# Patient Record
Sex: Male | Born: 1940
Health system: Southern US, Community
[De-identification: ages and names within clinical notes are randomized; demographics above are authoritative.]

## PROBLEM LIST (undated history)

## (undated) DIAGNOSIS — J439 Emphysema, unspecified: Secondary | ICD-10-CM

## (undated) DIAGNOSIS — I10 Essential (primary) hypertension: Secondary | ICD-10-CM

## (undated) DIAGNOSIS — I5032 Chronic diastolic (congestive) heart failure: Secondary | ICD-10-CM

## (undated) DIAGNOSIS — I4891 Unspecified atrial fibrillation: Secondary | ICD-10-CM

## (undated) DIAGNOSIS — I2781 Cor pulmonale (chronic): Secondary | ICD-10-CM

## (undated) DIAGNOSIS — I4821 Permanent atrial fibrillation: Secondary | ICD-10-CM

## (undated) DIAGNOSIS — J449 Chronic obstructive pulmonary disease, unspecified: Secondary | ICD-10-CM

## (undated) DIAGNOSIS — I509 Heart failure, unspecified: Secondary | ICD-10-CM

## (undated) HISTORY — PX: ABLATION: SHX5711

## (undated) HISTORY — PX: CHOLECYSTECTOMY: SHX55

## (undated) HISTORY — PX: BACK SURGERY: SHX140

## (undated) HISTORY — DX: Cor pulmonale (chronic): I27.81

## (undated) HISTORY — DX: Chronic diastolic (congestive) heart failure: I50.32

## (undated) HISTORY — PX: OTHER SURGICAL HISTORY: SHX169

## (undated) HISTORY — DX: Emphysema, unspecified: J43.9

## (undated) HISTORY — DX: Permanent atrial fibrillation: I48.21

## (undated) HISTORY — DX: Chronic obstructive pulmonary disease, unspecified: J44.9

## (undated) HISTORY — DX: Essential (primary) hypertension: I10

---

## 1995-08-14 HISTORY — PX: PACEMAKER INSERTION: SHX728

## 2015-07-17 DIAGNOSIS — J189 Pneumonia, unspecified organism: Secondary | ICD-10-CM | POA: Diagnosis not present

## 2015-09-19 ENCOUNTER — Emergency Department (HOSPITAL_COMMUNITY): Payer: Medicare HMO

## 2015-09-19 ENCOUNTER — Encounter (HOSPITAL_COMMUNITY): Payer: Self-pay

## 2015-09-19 DIAGNOSIS — Z7902 Long term (current) use of antithrombotics/antiplatelets: Secondary | ICD-10-CM | POA: Insufficient documentation

## 2015-09-19 DIAGNOSIS — J189 Pneumonia, unspecified organism: Secondary | ICD-10-CM | POA: Diagnosis not present

## 2015-09-19 DIAGNOSIS — J209 Acute bronchitis, unspecified: Secondary | ICD-10-CM | POA: Diagnosis not present

## 2015-09-19 DIAGNOSIS — F17200 Nicotine dependence, unspecified, uncomplicated: Secondary | ICD-10-CM | POA: Diagnosis not present

## 2015-09-19 DIAGNOSIS — I509 Heart failure, unspecified: Secondary | ICD-10-CM | POA: Insufficient documentation

## 2015-09-19 DIAGNOSIS — J40 Bronchitis, not specified as acute or chronic: Secondary | ICD-10-CM | POA: Diagnosis not present

## 2015-09-19 DIAGNOSIS — R05 Cough: Secondary | ICD-10-CM | POA: Diagnosis not present

## 2015-09-19 DIAGNOSIS — I10 Essential (primary) hypertension: Secondary | ICD-10-CM | POA: Diagnosis not present

## 2015-09-19 DIAGNOSIS — Z79899 Other long term (current) drug therapy: Secondary | ICD-10-CM | POA: Insufficient documentation

## 2015-09-19 DIAGNOSIS — J159 Unspecified bacterial pneumonia: Secondary | ICD-10-CM | POA: Diagnosis not present

## 2015-09-19 DIAGNOSIS — R0602 Shortness of breath: Secondary | ICD-10-CM | POA: Diagnosis not present

## 2015-09-19 LAB — I-STAT TROPONIN, ED: Troponin i, poc: 0.02 ng/mL (ref 0.00–0.08)

## 2015-09-19 LAB — BASIC METABOLIC PANEL
Anion gap: 14 (ref 5–15)
BUN: 26 mg/dL — ABNORMAL HIGH (ref 6–20)
CO2: 20 mmol/L — ABNORMAL LOW (ref 22–32)
Calcium: 9 mg/dL (ref 8.9–10.3)
Chloride: 102 mmol/L (ref 101–111)
Creatinine, Ser: 1.6 mg/dL — ABNORMAL HIGH (ref 0.61–1.24)
GFR calc Af Amer: 48 mL/min — ABNORMAL LOW (ref >60)
GFR calc non Af Amer: 41 mL/min — ABNORMAL LOW (ref >60)
Glucose, Bld: 154 mg/dL — ABNORMAL HIGH (ref 65–99)
Potassium: 3 mmol/L — ABNORMAL LOW (ref 3.5–5.1)
Sodium: 136 mmol/L (ref 135–145)

## 2015-09-19 LAB — CBC
HCT: 33.6 % — ABNORMAL LOW (ref 39.0–52.0)
Hemoglobin: 11.2 g/dL — ABNORMAL LOW (ref 13.0–17.0)
MCH: 31.2 pg (ref 26.0–34.0)
MCHC: 33.3 g/dL (ref 30.0–36.0)
MCV: 93.6 fL (ref 78.0–100.0)
Platelets: 232 10*3/uL (ref 150–400)
RBC: 3.59 MIL/uL — ABNORMAL LOW (ref 4.22–5.81)
RDW: 14.8 % (ref 11.5–15.5)
WBC: 6.4 10*3/uL (ref 4.0–10.5)

## 2015-09-19 LAB — PROTIME-INR
INR: 1.15 (ref 0.00–1.49)
Prothrombin Time: 14.8 seconds (ref 11.6–15.2)

## 2015-09-19 NOTE — ED Notes (Signed)
Pt reports SOB, chills, hot flashes, coughing (yellow mucus "not much") and wheezing onset Friday night. Denies chest pain or any pain. RR unlabored, skin warm/dry. VSS.

## 2015-09-20 ENCOUNTER — Emergency Department (HOSPITAL_COMMUNITY)
Admission: EM | Admit: 2015-09-20 | Discharge: 2015-09-20 | Disposition: A | Discharge status: Home / Self Care | Payer: Medicare HMO | Attending: Emergency Medicine | Admitting: Emergency Medicine

## 2015-09-20 DIAGNOSIS — J40 Bronchitis, not specified as acute or chronic: Secondary | ICD-10-CM

## 2015-09-20 DIAGNOSIS — J189 Pneumonia, unspecified organism: Secondary | ICD-10-CM

## 2015-09-20 HISTORY — DX: Heart failure, unspecified: I50.9

## 2015-09-20 HISTORY — DX: Essential (primary) hypertension: I10

## 2015-09-20 HISTORY — DX: Unspecified atrial fibrillation: I48.91

## 2015-09-20 MED ORDER — ALBUTEROL SULFATE HFA 108 (90 BASE) MCG/ACT IN AERS
2.0000 | INHALATION_SPRAY | Freq: Once | RESPIRATORY_TRACT | Status: AC
  Administered 2015-09-20: 2 via RESPIRATORY_TRACT
  Filled 2015-09-20: qty 6.7

## 2015-09-20 MED ORDER — ALBUTEROL SULFATE HFA 108 (90 BASE) MCG/ACT IN AERS: 2.0000 | INHALATION_SPRAY | Freq: Four times a day (QID) | RESPIRATORY_TRACT | Status: AC | PRN

## 2015-09-20 MED ORDER — DEXTROSE 5 % IV SOLN
1.0000 g | Freq: Once | INTRAVENOUS | Status: AC
  Administered 2015-09-20: 1 g via INTRAVENOUS
  Filled 2015-09-20: qty 10

## 2015-09-20 MED ORDER — POTASSIUM CHLORIDE 10 MEQ/100ML IV SOLN
10.0000 meq | Freq: Once | INTRAVENOUS | Status: AC
  Administered 2015-09-20: 10 meq via INTRAVENOUS
  Filled 2015-09-20: qty 100

## 2015-09-20 MED ORDER — AZITHROMYCIN 250 MG PO TABS
250.0000 mg | ORAL_TABLET | Freq: Every day | ORAL | Status: DC
Start: 2015-09-20 — End: 2018-09-03

## 2015-09-20 MED ORDER — CEFTRIAXONE SODIUM 1 G IJ SOLR: 1.0000 g | Freq: Once | INTRAMUSCULAR | Status: DC

## 2015-09-20 MED ORDER — METHYLPREDNISOLONE SODIUM SUCC 125 MG IJ SOLR
80.0000 mg | Freq: Once | INTRAMUSCULAR | Status: AC
  Administered 2015-09-20: 80 mg via INTRAVENOUS
  Filled 2015-09-20: qty 2

## 2015-09-20 MED ORDER — AZITHROMYCIN 250 MG PO TABS
500.0000 mg | ORAL_TABLET | Freq: Once | ORAL | Status: AC
  Administered 2015-09-20: 500 mg via ORAL
  Filled 2015-09-20: qty 2

## 2015-09-20 MED ORDER — PREDNISONE 20 MG PO TABS: 40.0000 mg | ORAL_TABLET | Freq: Every day | ORAL | Status: DC

## 2015-09-20 MED ORDER — POTASSIUM CHLORIDE CRYS ER 20 MEQ PO TBCR
40.0000 meq | EXTENDED_RELEASE_TABLET | Freq: Once | ORAL | Status: AC
  Administered 2015-09-20: 40 meq via ORAL
  Filled 2015-09-20: qty 2

## 2015-09-20 NOTE — ED Notes (Signed)
MD at bedside. 

## 2015-09-20 NOTE — ED Notes (Addendum)
This EMT ambulated pt in hallway with pulse ox, stats were 91% throughout duration of walking. Pt only complained of sob when getting back into bed.  While walking pt looked to be in no signs of distress, able to hold a conversation.

## 2015-09-20 NOTE — Discharge Instructions (Signed)
You were seen today for shortness of breath. He likely have pneumonia as well as an element of bronchitis given your smoking history. He will be discharged with an inhaler and steroids as well as antibiotics. See return precautions below.  Community-Acquired Pneumonia, Adult Pneumonia is an infection of the lungs. There are different types of pneumonia. One type can develop while a person is in a hospital. A different type, called community-acquired pneumonia, develops in people who are not, or have not recently been, in the hospital or other health care facility.  CAUSES Pneumonia may be caused by bacteria, viruses, or funguses. Community-acquired pneumonia is often caused by Streptococcus pneumonia bacteria. These bacteria are often passed from one person to another by breathing in droplets from the cough or sneeze of an infected person. RISK FACTORS The condition is more likely to develop in:  People who havechronic diseases, such as chronic obstructive pulmonary disease (COPD), asthma, congestive heart failure, cystic fibrosis, diabetes, or kidney disease.  People who haveearly-stage or late-stage HIV.  People who havesickle cell disease.  People who havehad their spleen removed (splenectomy).  People who havepoor Human resources officer.  People who havemedical conditions that increase the risk of breathing in (aspirating) secretions their own mouth and nose.   People who havea weakened immune system (immunocompromised).  People who smoke.  People whotravel to areas where pneumonia-causing germs commonly exist.  People whoare around animal habitats or animals that have pneumonia-causing germs, including birds, bats, rabbits, cats, and farm animals. SYMPTOMS Symptoms of this condition include:  Adry cough.  A wet (productive) cough.  Fever.  Sweating.  Chest pain, especially when breathing deeply or coughing.  Rapid breathing or difficulty breathing.  Shortness of  breath.  Shaking chills.  Fatigue.  Muscle aches. DIAGNOSIS Your health care provider will take a medical history and perform a physical exam. You may also have other tests, including:  Imaging studies of your chest, including X-rays.  Tests to check your blood oxygen level and other blood gases.  Other tests on blood, mucus (sputum), fluid around your lungs (pleural fluid), and urine. If your pneumonia is severe, other tests may be done to identify the specific cause of your illness. TREATMENT The type of treatment that you receive depends on many factors, such as the cause of your pneumonia, the medicines you take, and other medical conditions that you have. For most adults, treatment and recovery from pneumonia may occur at home. In some cases, treatment must happen in a hospital. Treatment may include:  Antibiotic medicines, if the pneumonia was caused by bacteria.  Antiviral medicines, if the pneumonia was caused by a virus.  Medicines that are given by mouth or through an IV tube.  Oxygen.  Respiratory therapy. Although rare, treating severe pneumonia may include:  Mechanical ventilation. This is done if you are not breathing well on your own and you cannot maintain a safe blood oxygen level.  Thoracentesis. This procedureremoves fluid around one lung or both lungs to help you breathe better. HOME CARE INSTRUCTIONS  Take over-the-counter and prescription medicines only as told by your health care provider.  Only takecough medicine if you are losing sleep. Understand that cough medicine can prevent your body's natural ability to remove mucus from your lungs.  If you were prescribed an antibiotic medicine, take it as told by your health care provider. Do not stop taking the antibiotic even if you start to feel better.  Sleep in a semi-upright position at night. Try sleeping in  a reclining chair, or place a few pillows under your head.  Do not use tobacco products,  including cigarettes, chewing tobacco, and e-cigarettes. If you need help quitting, ask your health care provider.  Drink enough water to keep your urine clear or pale yellow. This will help to thin out mucus secretions in your lungs. PREVENTION There are ways that you can decrease your risk of developing community-acquired pneumonia. Consider getting a pneumococcal vaccine if:  You are older than 74 years of age.  You are older than 74 years of age and are undergoing cancer treatment, have chronic lung disease, or have other medical conditions that affect your immune system. Ask your health care provider if this applies to you. There are different types and schedules of pneumococcal vaccines. Ask your health care provider which vaccination option is best for you. You may also prevent community-acquired pneumonia if you take these actions:  Get an influenza vaccine every year. Ask your health care provider which type of influenza vaccine is best for you.  Go to the dentist on a regular basis.  Wash your hands often. Use hand sanitizer if soap and water are not available. SEEK MEDICAL CARE IF:  You have a fever.  You are losing sleep because you cannot control your cough with cough medicine. SEEK IMMEDIATE MEDICAL CARE IF:  You have worsening shortness of breath.  You have increased chest pain.  Your sickness becomes worse, especially if you are an older adult or have a weakened immune system.  You cough up blood.   This information is not intended to replace advice given to you by your health care provider. Make sure you discuss any questions you have with your health care provider.   Document Released: 07/30/2005 Document Revised: 04/20/2015 Document Reviewed: 11/24/2014 Elsevier Interactive Patient Education [employer].

## 2015-09-20 NOTE — ED Provider Notes (Signed)
CSN: [id number]     Arrival date & time 09/19/15  2034 History  By signing my name below, I, Johnny Woods, attest that this documentation has been prepared under the direction and in the presence of Merryl Hacker, MD Electronically Signed: Evonnie Dawes, ED Scribe 09/20/2015 at 5:57 AM.    Chief Complaint  Patient presents with  . Shortness of Breath   The history is provided by the patient. No language interpreter was used.   HPI Comments: Johnny Woods is a 75 y.o. male with a pacemaker, who presents to the Emergency Department complaining of intermittent chills, hot flashes, cough and a slight fever with Tmax 40F, which began after getting a pneumonia vaccination on Friday, 2/3. Pt confirms some SOB. Pt denies any CP, sore throat, or N/V/D. Pt is a current smoker. Pt denies any hx of COPD.  is a current smoker. History of heart failure and atrial fibrillation.  Past Medical History  Diagnosis Date  . Atrial fibrillation ([place])   . Hypertension   . CHF (congestive heart failure) [facility])    Past Surgical History  Procedure Laterality Date  . Pacemaker insertion  1997  . Ablation    . Back surgery    . Cholecystectomy    . Ruptured testicle     No family history on file. Social History  Substance Use Topics  . Smoking status: Current Some Day Smoker  . Smokeless tobacco: None  . Alcohol Use: No    Review of Systems  Constitutional: Positive for fever and chills.       Hot flashes  HENT: Negative for sore throat.   Respiratory: Positive for cough (Productive), shortness of breath and wheezing.   Cardiovascular: Negative for chest pain.  Gastrointestinal: Negative for nausea, vomiting and diarrhea.  Skin: Negative for rash.  All other systems reviewed and are negative.  Allergies  Review of patient's allergies indicates no known allergies.  Home Medications   Prior to Admission medications   Medication Sig Start Date End Date Taking? Authorizing Provider   amLODipine (NORVASC) 10 MG tablet Take 10 mg by mouth at bedtime.   Yes Historical Provider, MD  clopidogrel (PLAVIX) 75 MG tablet Take 75 mg by mouth daily.   Yes Historical Provider, MD  doxazosin (CARDURA) 8 MG tablet Take 8 mg by mouth at bedtime.   Yes Historical Provider, MD  enalapril (VASOTEC) 20 MG tablet Take 20 mg by mouth 2 (two) times daily.   Yes Historical Provider, MD  hydrochlorothiazide (HYDRODIURIL) 25 MG tablet Take 25 mg by mouth daily as needed (fluid/swelling).   Yes Historical Provider, MD  vitamin C (ASCORBIC ACID) 500 MG tablet Take 1,000 mg by mouth daily.   Yes Historical Provider, MD  vitamin E 400 UNIT capsule Take 400 Units by mouth daily.   Yes Historical Provider, MD  albuterol (PROVENTIL HFA;VENTOLIN HFA) 108 (90 Base) MCG/ACT inhaler Inhale 2 puffs into the lungs every 6 (six) hours as needed for wheezing or shortness of breath. 09/20/15   Merryl Hacker, MD  azithromycin (ZITHROMAX) 250 MG tablet Take 1 tablet (250 mg total) by mouth daily. Take first 2 tablets together, then 1 every day until finished. 09/20/15   Merryl Hacker, MD  predniSONE (DELTASONE) 20 MG tablet Take 2 tablets (40 mg total) by mouth daily with breakfast. 09/20/15   Merryl Hacker, MD   BP 139/58 mmHg  Pulse 65  Temp(Src) 98.5 F (36.9 C) (Oral)  Resp 17  SpO2  95% Physical Exam  Constitutional: He is oriented to person, place, and time. He appears well-developed and well-nourished. No distress.  HENT:  Head: Normocephalic and atraumatic.  Cardiovascular: Normal rate, regular rhythm and normal heart sounds.   No murmur heard. Pulmonary/Chest: Effort normal. No respiratory distress. He has wheezes.  Scant expiratory wheezing  Abdominal: Soft. Bowel sounds are normal. There is no tenderness. There is no rebound.  Musculoskeletal: He exhibits no edema.  Neurological: He is alert and oriented to person, place, and time.  Skin: Skin is warm and dry.  Psychiatric: He has a normal  mood and affect.  Nursing note and vitals reviewed.   ED Course  Procedures   DIAGNOSTIC STUDIES: Oxygen Saturation is 95% on RA, adequate by my interpretation.    COORDINATION OF CARE: 1:15 AM-Discussed treatment plan which includes DG Chest, Blood work, EKG, and cardiac monitoring,  with pt at bedside and pt agreed to plan.   Labs Review Labs Reviewed  BASIC METABOLIC PANEL - Abnormal; Notable for the following:    Potassium 3.0 (*)    CO2 20 (*)    Glucose, Bld 154 (*)    BUN 26 (*)    Creatinine, Ser 1.60 (*)    GFR calc non Af Amer 41 (*)    GFR calc Af Amer 48 (*)    All other components within normal limits  CBC - Abnormal; Notable for the following:    RBC 3.59 (*)    Hemoglobin 11.2 (*)    HCT 33.6 (*)    All other components within normal limits  PROTIME-INR  I-STAT TROPOININ, ED    Imaging Review Dg Chest 2 View  09/19/2015  CLINICAL DATA:  Shortness of breath chills cough flashes coughing productive cough for 3 days EXAM: CHEST  2 VIEW COMPARISON:  There are no prior studies for comparison FINDINGS: Two lead pacer with generator over left thorax. Heart size upper normal. Vascular pattern normal. Moderately severe diffuse interstitial prominence. No consolidation. Tiny right pleural effusion. IMPRESSION: Moderately severe interstitial disease diffusely in both lungs. Differential diagnostic possibilities include atypical pneumonia or chronic interstitial lung disease. Chronicity uncertain without the benefit of comparison. Tiny nonspecific right pleural effusion. Electronically Signed   By: Skipper Cliche M.D.   On: 09/19/2015 21:37   I have personally reviewed and evaluated these images and lab results as part of my medical decision-making.   EKG Interpretation   Date/Time:  Monday September 19 2015 20:42:41 EST Ventricular Rate:  72 PR Interval:    QRS Duration: 154 QT Interval:  506 QTC Calculation: 554 R Axis:   -67 Text Interpretation:   Ventricular-paced rhythm Abnormal ECG No prior for  comparison Confirmed by HORTON  MD, [place] ([id number]) on 09/20/2015 1:04:08  AM      MDM   Final diagnoses:  Bronchitis  Atypical pneumonia   Patient presents with cough and shortness of breath. Nontoxic on exam. Afebrile. Does have some scant expiratory wheezing. Is a current smoker. No reported history of COPD. Chest x-ray shows moderately severe interstitial disease diffusely. Atypical pneumonia versus chronic interstitial lung disease. Initially patient ambulated and dropped his O2 sats to 88% and felt short of breath. He was given steroids and an albuterol inhaler. He was given Rocephin and azithromycin for presumed community-acquired pneumonia. No history of recent hospitalizations.  On recheck, patient reports improvement of shortness of breath after inhaler. He continues to have some scant wheezing. Patient was instructed to use his inhaler one more time.  He was then ambulated and maintained his oxygen saturations greater than 91%. Suspect some element of bronchitis or bronchospasm. Will discharge home with azithromycin, steroids, and an inhaler.  Follow-up with primary physician. Patient was given strict return precautions.  After history, exam, and medical workup I feel the patient has been appropriately medically screened and is safe for discharge home. Pertinent diagnoses were discussed with the patient. Patient was given return precautions.  I personally performed the services described in this documentation, which was scribed in my presence. The recorded information has been reviewed and is accurate.      Merryl Hacker, MD 09/20/15 [phone]

## 2015-09-20 NOTE — ED Notes (Signed)
Ambulated patient in hallway while checking pulse oximetry. Prior to ambulation patient pulse rate was 63 and oxygen levels were 91% on room air. Throughout ambulation pulse rate increased to 80 and oxygen levels fluctuated, going down to as low as 88% and as high as 92% on room air. Towards the end of ambulation patient begins to state he feels "short of breath", however speaking full sentences and no apparent distress noted.

## 2015-11-23 DIAGNOSIS — R634 Abnormal weight loss: Secondary | ICD-10-CM | POA: Diagnosis not present

## 2015-11-23 DIAGNOSIS — R935 Abnormal findings on diagnostic imaging of other abdominal regions, including retroperitoneum: Secondary | ICD-10-CM | POA: Diagnosis not present

## 2017-12-04 IMAGING — DX DG CHEST 2V
2 series · 2 of 2 positions shown · non-contrast
Comparison: There are no prior studies for comparison

CLINICAL DATA: Shortness of breath chills cough flashes coughing
productive cough for 3 days

EXAM:
CHEST  2 VIEW

[chest pa]
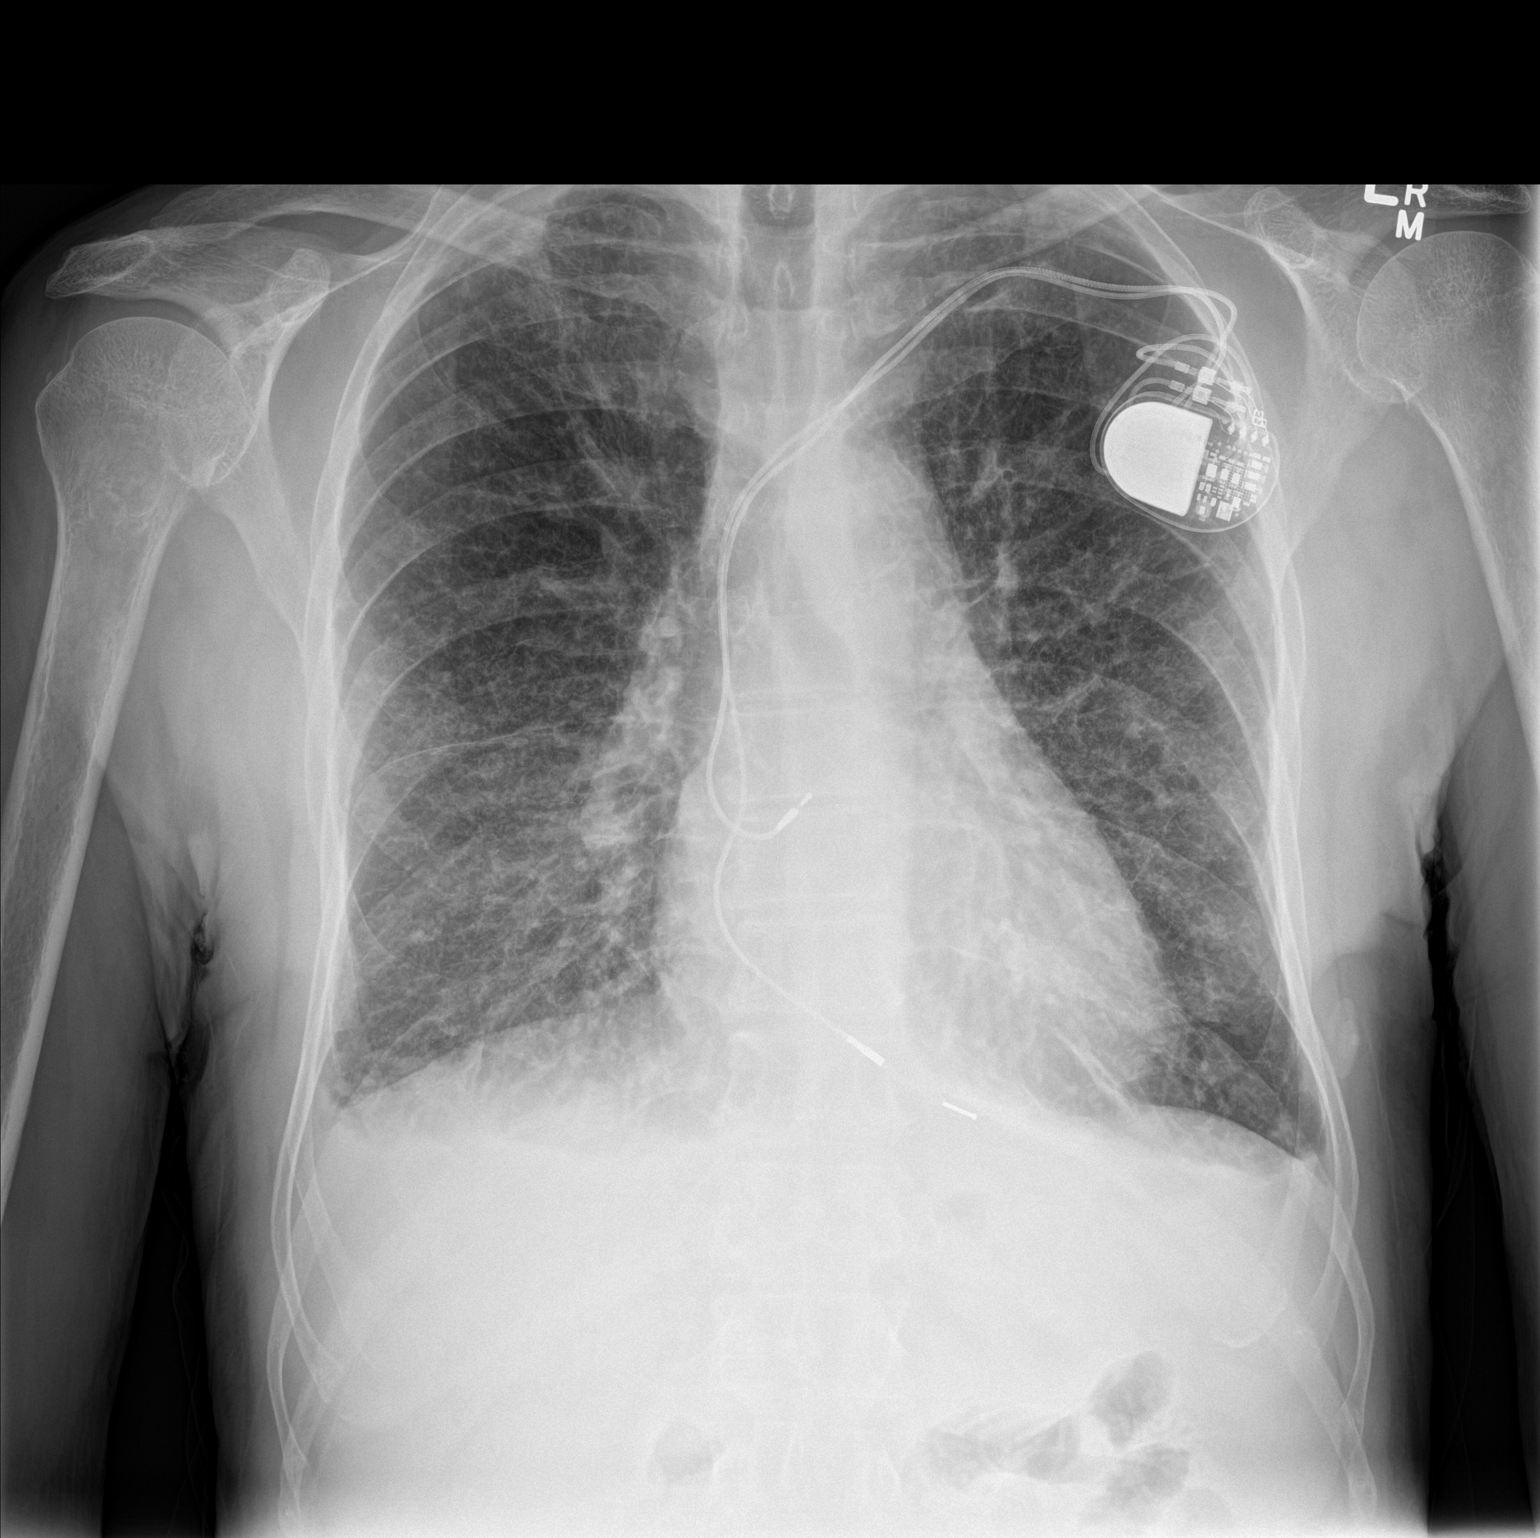

[chest lat]
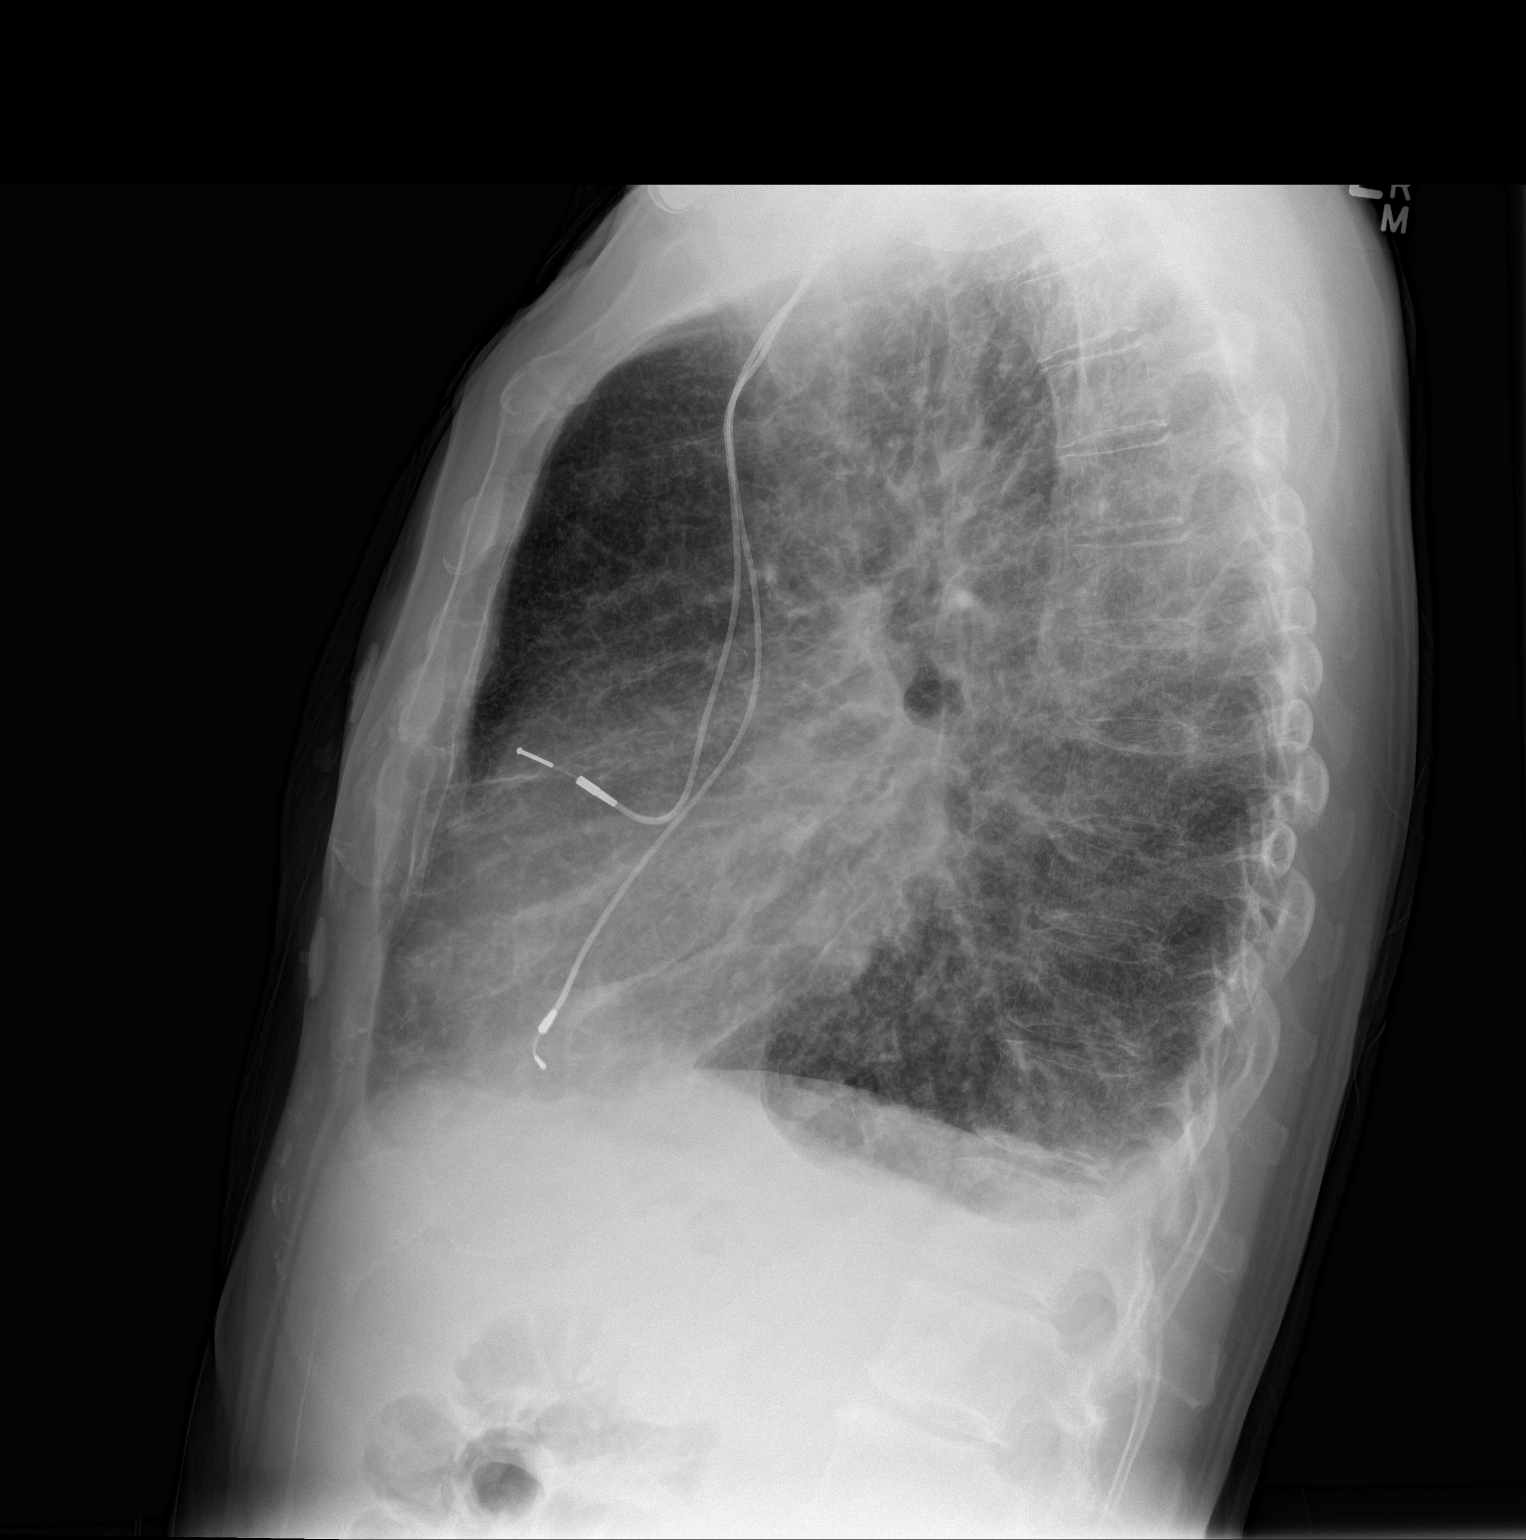

[2 of 2 positions shown; findings below may reference images not displayed]

FINDINGS: Two lead pacer with generator over left thorax. Heart size upper
normal. Vascular pattern normal. Moderately severe diffuse
interstitial prominence. No consolidation. Tiny right pleural
effusion.
IMPRESSION: Moderately severe interstitial disease diffusely in both lungs.
Differential diagnostic possibilities include atypical pneumonia or
chronic interstitial lung disease. Chronicity uncertain without the
benefit of comparison.

Tiny nonspecific right pleural effusion.

## 2018-08-13 DIAGNOSIS — C3490 Malignant neoplasm of unspecified part of unspecified bronchus or lung: Secondary | ICD-10-CM

## 2018-08-13 HISTORY — DX: Malignant neoplasm of unspecified part of unspecified bronchus or lung: C34.90

## 2018-09-03 ENCOUNTER — Other Ambulatory Visit: Payer: Self-pay

## 2018-09-03 ENCOUNTER — Emergency Department (HOSPITAL_COMMUNITY)
Admission: EM | Admit: 2018-09-03 | Discharge: 2018-09-03 | Disposition: A | Discharge status: Home / Self Care | Payer: Medicare HMO | Attending: Emergency Medicine | Admitting: Emergency Medicine

## 2018-09-03 DIAGNOSIS — Z7901 Long term (current) use of anticoagulants: Secondary | ICD-10-CM | POA: Diagnosis not present

## 2018-09-03 DIAGNOSIS — I110 Hypertensive heart disease with heart failure: Secondary | ICD-10-CM | POA: Diagnosis not present

## 2018-09-03 DIAGNOSIS — Z950 Presence of cardiac pacemaker: Secondary | ICD-10-CM | POA: Diagnosis not present

## 2018-09-03 DIAGNOSIS — Z79899 Other long term (current) drug therapy: Secondary | ICD-10-CM | POA: Diagnosis not present

## 2018-09-03 DIAGNOSIS — T783XXA Angioneurotic edema, initial encounter: Secondary | ICD-10-CM | POA: Diagnosis not present

## 2018-09-03 DIAGNOSIS — I509 Heart failure, unspecified: Secondary | ICD-10-CM | POA: Insufficient documentation

## 2018-09-03 DIAGNOSIS — R220 Localized swelling, mass and lump, head: Secondary | ICD-10-CM | POA: Diagnosis present

## 2018-09-03 DIAGNOSIS — F17210 Nicotine dependence, cigarettes, uncomplicated: Secondary | ICD-10-CM | POA: Insufficient documentation

## 2018-09-03 MED ORDER — METHYLPREDNISOLONE SODIUM SUCC 125 MG IJ SOLR
125.0000 mg | Freq: Once | INTRAMUSCULAR | Status: AC
Start: 2018-09-03 — End: 2018-09-03
  Administered 2018-09-03: 125 mg via INTRAVENOUS
  Filled 2018-09-03: qty 2

## 2018-09-03 MED ORDER — FAMOTIDINE IN NACL 20-0.9 MG/50ML-% IV SOLN
20.0000 mg | Freq: Once | INTRAVENOUS | Status: AC
Start: 2018-09-03 — End: 2018-09-03
  Administered 2018-09-03: 20 mg via INTRAVENOUS
  Filled 2018-09-03: qty 50

## 2018-09-03 MED ORDER — DIPHENHYDRAMINE HCL 50 MG/ML IJ SOLN
25.0000 mg | Freq: Once | INTRAMUSCULAR | Status: AC
Start: 2018-09-03 — End: 2018-09-03
  Administered 2018-09-03: 25 mg via INTRAVENOUS
  Filled 2018-09-03: qty 1

## 2018-09-03 NOTE — ED Triage Notes (Signed)
Per pt he was at home and felt his bottom lip was swelling about 8:30 tonight and then it progressively got bigger when he woke up and now the entire bottom lip is swollen. Pt has no difficulty breathing at this time

## 2018-09-03 NOTE — ED Provider Notes (Signed)
[place] EMERGENCY DEPARTMENT Provider Note   CSN: [id number] Arrival date & time: 09/03/18  0220     History   Chief Complaint Chief Complaint  Patient presents with  . Facial Swelling    lower bottom lip    HPI Zohar Maroney Shannon is a 78 y.o. male with a history of atrial fibrillation on Eliquis, congestive heart failure, and hypertension who presents to the emergency department with a chief complaint of lip swelling.  The patient notes constant, worsening swelling to his upper lip that he first noticed yesterday morning.  He reports initial onset was gradual, but noticed the swelling looked much worse around 8:30 PM on the right side of the lower lip.  He reports that when he awoke around 1 AM that his entire lower lip was swollen.   He denies chest pain, shortness of breath, sore throat, dysphagia, upper lip, tongue, facial, or neck swelling, or rash.   No treatment prior to arrival.  He reports that he has taken enalapril for more than 20 years.  Last dose was this morning. He is scheduled for a lung biopsy at [facility] tomorrow at 9:30 AM.   The history is provided by the patient. No language interpreter was used.    Past Medical History:  Diagnosis Date  . Atrial fibrillation ([place])   . CHF (congestive heart failure) ([place])   . Hypertension     There are no active problems to display for this patient.   Past Surgical History:  Procedure Laterality Date  . ABLATION    . BACK SURGERY    . CHOLECYSTECTOMY    . PACEMAKER INSERTION  1997  . ruptured testicle          Home Medications    Prior to Admission medications   Medication Sig Start Date End Date Taking? Authorizing Provider  albuterol (PROVENTIL HFA;VENTOLIN HFA) 108 (90 Base) MCG/ACT inhaler Inhale 2 puffs into the lungs every 6 (six) hours as needed for wheezing or shortness of breath. 09/20/15  Yes Horton, Barbette Hair, MD  amLODipine (NORVASC) 10 MG tablet Take 10 mg by mouth at bedtime.    Yes [provider]  apixaban (ELIQUIS) 5 MG TABS tablet Take 5 mg by mouth 2 (two) times daily.   Yes [provider]  atorvastatin (LIPITOR) 20 MG tablet Take 20 mg by mouth daily.   Yes [provider]  doxazosin (CARDURA) 8 MG tablet Take 8 mg by mouth at bedtime.   Yes [provider]  enalapril (VASOTEC) 20 MG tablet Take 20 mg by mouth 2 (two) times daily.   Yes [provider]  hydrochlorothiazide (HYDRODIURIL) 25 MG tablet Take 25 mg by mouth daily as needed (fluid/swelling).   Yes [provider]  tiotropium (SPIRIVA) 18 MCG inhalation capsule Place 18 mcg into inhaler and inhale daily.   Yes [provider]  vitamin C (ASCORBIC ACID) 500 MG tablet Take 1,000 mg by mouth daily.   Yes [provider]  vitamin E 400 UNIT capsule Take 400 Units by mouth daily.   Yes [provider]    Family History No family history on file.  Social History Social History   Tobacco Use  . Smoking status: Current Some Day Smoker  Substance Use Topics  . Alcohol use: No  . Drug use: Not on file     Allergies   Patient has no known allergies.   Review of Systems Review of Systems  Constitutional: Negative  for appetite change and fever.  HENT: Positive for facial swelling. Negative for sore throat and trouble swallowing.   Respiratory: Negative for cough and shortness of breath.   Cardiovascular: Negative for chest pain, palpitations and leg swelling.  Gastrointestinal: Negative for abdominal pain.  Genitourinary: Negative for dysuria.  Musculoskeletal: Negative for back pain, myalgias, neck pain and neck stiffness.  Skin: Negative for rash.  Allergic/Immunologic: Negative for immunocompromised state.  Neurological: Negative for headaches.  Psychiatric/Behavioral: Negative for confusion.   Physical Exam Updated Vital Signs BP 138/60   Pulse 64   Temp (!) 97.4 F (36.3 C) (Oral)   Resp 14   Ht 5\' 9"   (1.753 m)   Wt 64.4 kg   SpO2 96%   BMI 20.97 kg/m   Physical Exam Vitals signs and nursing note reviewed.  Constitutional:      Appearance: He is well-developed.  HENT:     Head: Normocephalic.     Mouth/Throat:     Mouth: Angioedema present. No oral lesions.     Comments: Angioedema noted to the inferior lip.  No sublingual tenderness or induration.  No induration noted to the anterior neck.  He is speaking in complete, fluent sentences.  He is tolerating secretions.  Full active and passive range of motion of the neck. Eyes:     Conjunctiva/sclera: Conjunctivae normal.  Neck:     Musculoskeletal: Neck supple.  Cardiovascular:     Rate and Rhythm: Normal rate and regular rhythm.     Heart sounds: No murmur.  Pulmonary:     Effort: Pulmonary effort is normal. No respiratory distress.     Breath sounds: No stridor. No wheezing, rhonchi or rales.  Chest:     Chest wall: No tenderness.  Abdominal:     General: There is no distension.     Palpations: Abdomen is soft.  Skin:    General: Skin is warm and dry.     Comments: No urticarial rash.  Neurological:     Mental Status: He is alert.  Psychiatric:        Behavior: Behavior normal.        ED Treatments / Results  Labs (all labs ordered are listed, but only abnormal results are displayed) Labs Reviewed - No data to display  EKG None  Radiology No results found.  Procedures Procedures (including critical care time)  Medications Ordered in ED Medications  diphenhydrAMINE (BENADRYL) injection 25 mg (25 mg Intravenous Given 09/03/18 0340)  famotidine (PEPCID) IVPB 20 mg premix (0 mg Intravenous Stopped 09/03/18 0441)  methylPREDNISolone sodium succinate (SOLU-MEDROL) 125 mg/2 mL injection 125 mg (125 mg Intravenous Given 09/03/18 0340)     Initial Impression / Assessment and Plan / ED Course  I have reviewed the triage vital signs and the nursing notes.  Pertinent labs & imaging results that were available  during my care of the patient were reviewed by me and considered in my medical decision making (see chart for details).     78 year old male with a history of atrial fibrillation on Eliquis, congestive heart failure, and hypertension presenting with angioedema, onset today.  He has been on enalapril for more than 20 years.  He has no chest pain, shortness of breath, or urticarial rash.  He speaking in complete, fluent sentences and tolerating to secretions well.  Methylprednisolone, Benadryl, and Pepcid given in the ED. The patient was seen and evaluated along with Dr. Leonette Monarch, attending physician.   The patient was observed for >5 hours.  Swelling has improved.  He continues to have noted shortness of breath, chest pain.  Will discharge to home with PCP follow-up.  The patient has been advised to discontinue his enalapril, but continue his amlodipine, HCTZ, and doxazosin until he can be seen by his PCP.  Strict return precautions given.  He is hemodynamically stable and in no acute distress.  She is safe for discharge home with outpatient follow-up at this time.   Final Clinical Impressions(s) / ED Diagnoses   Final diagnoses:  Angioedema, initial encounter    ED Discharge Orders    None       Joanne Gavel, PA-C 09/03/18 [place], [place], MD 09/04/18 [phone]

## 2018-09-03 NOTE — Discharge Instructions (Signed)
Thank you for allowing me to care for you today in the Emergency Department.   DO NOT TAKE ANY ADDITIONAL DOSES OF YOUR HOME VASOTEC (enalapril). This is likely what caused your symptoms today.  Continue to take your home as prescribed:  -Hydrochlorothiazide -Amlodipine - Doxazosin  Please call your primary care provider to schedule a follow-up appointment.  Call [facility] at the number you were given to determine if you were able to have your lung biopsy today.  Return to the emergency department if you develop worsening swelling of your lips, face, neck, difficulty breathing, chest pain, head to toe rash, or other new, concerning symptoms.

## 2018-09-03 NOTE — ED Notes (Signed)
Pt able to speak in complete sentences, no signs of respiratory distress at this time.  No trouble swallowing.

## 2018-09-03 NOTE — ED Notes (Signed)
Swelling has decreased slightly.  Continues to not have any trouble breathing, talking or swallowing.

## 2018-09-21 ENCOUNTER — Emergency Department (HOSPITAL_COMMUNITY)
Admission: EM | Admit: 2018-09-21 | Discharge: 2018-09-21 | Disposition: A | Discharge status: Home / Self Care | Payer: Medicare HMO | Attending: Emergency Medicine | Admitting: Emergency Medicine

## 2018-09-21 ENCOUNTER — Encounter (HOSPITAL_COMMUNITY): Payer: Self-pay

## 2018-09-21 ENCOUNTER — Emergency Department (HOSPITAL_COMMUNITY): Payer: Medicare HMO

## 2018-09-21 DIAGNOSIS — F17200 Nicotine dependence, unspecified, uncomplicated: Secondary | ICD-10-CM | POA: Diagnosis not present

## 2018-09-21 DIAGNOSIS — Z79899 Other long term (current) drug therapy: Secondary | ICD-10-CM | POA: Diagnosis not present

## 2018-09-21 DIAGNOSIS — J40 Bronchitis, not specified as acute or chronic: Secondary | ICD-10-CM | POA: Insufficient documentation

## 2018-09-21 DIAGNOSIS — R0602 Shortness of breath: Secondary | ICD-10-CM | POA: Diagnosis not present

## 2018-09-21 DIAGNOSIS — Z950 Presence of cardiac pacemaker: Secondary | ICD-10-CM | POA: Diagnosis not present

## 2018-09-21 DIAGNOSIS — R05 Cough: Secondary | ICD-10-CM | POA: Diagnosis not present

## 2018-09-21 LAB — BRAIN NATRIURETIC PEPTIDE: B Natriuretic Peptide: 321.1 pg/mL — ABNORMAL HIGH (ref 0.0–100.0)

## 2018-09-21 LAB — CBC
HCT: 32.5 % — ABNORMAL LOW (ref 39.0–52.0)
Hemoglobin: 10.4 g/dL — ABNORMAL LOW (ref 13.0–17.0)
MCH: 31.4 pg (ref 26.0–34.0)
MCHC: 32 g/dL (ref 30.0–36.0)
MCV: 98.2 fL (ref 80.0–100.0)
Platelets: 149 10*3/uL — ABNORMAL LOW (ref 150–400)
RBC: 3.31 MIL/uL — ABNORMAL LOW (ref 4.22–5.81)
RDW: 14.4 % (ref 11.5–15.5)
WBC: 5.7 10*3/uL (ref 4.0–10.5)
nRBC: 0 % (ref 0.0–0.2)

## 2018-09-21 LAB — BASIC METABOLIC PANEL
Anion gap: 10 (ref 5–15)
BUN: 40 mg/dL — ABNORMAL HIGH (ref 8–23)
CO2: 20 mmol/L — ABNORMAL LOW (ref 22–32)
Calcium: 8.9 mg/dL (ref 8.9–10.3)
Chloride: 107 mmol/L (ref 98–111)
Creatinine, Ser: 2.03 mg/dL — ABNORMAL HIGH (ref 0.61–1.24)
GFR calc Af Amer: 36 mL/min — ABNORMAL LOW (ref >60)
GFR calc non Af Amer: 31 mL/min — ABNORMAL LOW (ref >60)
Glucose, Bld: 134 mg/dL — ABNORMAL HIGH (ref 70–99)
Potassium: 3.7 mmol/L (ref 3.5–5.1)
Sodium: 137 mmol/L (ref 135–145)

## 2018-09-21 MED ORDER — IPRATROPIUM-ALBUTEROL 0.5-2.5 (3) MG/3ML IN SOLN
3.0000 mL | Freq: Once | RESPIRATORY_TRACT | Status: AC
  Administered 2018-09-21: 3 mL via RESPIRATORY_TRACT
  Filled 2018-09-21: qty 3

## 2018-09-21 MED ORDER — PREDNISONE 50 MG PO TABS: 50.0000 mg | ORAL_TABLET | Freq: Every day | ORAL | 0 refills | Status: DC

## 2018-09-21 MED ORDER — DOXYCYCLINE HYCLATE 100 MG PO CAPS: 100.0000 mg | ORAL_CAPSULE | Freq: Two times a day (BID) | ORAL | 0 refills | Status: DC

## 2018-09-21 MED ORDER — SODIUM CHLORIDE 0.9% FLUSH: 3.0000 mL | Freq: Once | INTRAVENOUS | Status: DC

## 2018-09-21 NOTE — Discharge Instructions (Addendum)
Follow up with your pulmonary doctors, continue your current medications, take the new medications as prescribed

## 2018-09-21 NOTE — ED Provider Notes (Signed)
[facility] EMERGENCY DEPARTMENT Provider Note   CSN: [id number] Arrival date & time: 09/21/18  1221     History   Chief Complaint Chief Complaint  Patient presents with  . Shortness of Breath  . Cough    HPI Johnny Woods is a 78 y.o. male.   Shortness of Breath  Associated symptoms: cough   Cough  Associated symptoms: shortness of breath    Pt started having trouble with cough and congestion over the weekend.  He has been coughing and has felt chilled.  Pt had a fever to 102 yesterday.  This morning he felt worse.  He is not on home oxygen but he was able to check his pulse ox at home.  It was in the 80s.  Pt also has history of pleural effusion and had a thoracentesis several weeks ago.  Pt had a lung biopsy about a week ago at the [place].   Past Medical History:  Diagnosis Date  . Atrial fibrillation ([place])   . CHF (congestive heart failure) ([place])   . Hypertension     There are no active problems to display for this patient.   Past Surgical History:  Procedure Laterality Date  . ABLATION    . BACK SURGERY    . CHOLECYSTECTOMY    . PACEMAKER INSERTION  1997  . ruptured testicle          Home Medications    Prior to Admission medications   Medication Sig Start Date End Date Taking? Authorizing Provider  albuterol (PROVENTIL HFA;VENTOLIN HFA) 108 (90 Base) MCG/ACT inhaler Inhale 2 puffs into the lungs every 6 (six) hours as needed for wheezing or shortness of breath. 09/20/15   Horton, Barbette Hair, MD  amLODipine (NORVASC) 10 MG tablet Take 10 mg by mouth at bedtime.    [provider]  apixaban (ELIQUIS) 5 MG TABS tablet Take 5 mg by mouth 2 (two) times daily.    [provider]  atorvastatin (LIPITOR) 20 MG tablet Take 20 mg by mouth daily.    [provider]  doxazosin (CARDURA) 8 MG tablet Take 8 mg by mouth at bedtime.    [provider]  doxycycline (VIBRAMYCIN) 100 MG capsule Take 1 capsule (100 mg  total) by mouth 2 (two) times daily. 09/21/18   Dorie Rank, MD  enalapril (VASOTEC) 20 MG tablet Take 20 mg by mouth 2 (two) times daily.    [provider]  hydrochlorothiazide (HYDRODIURIL) 25 MG tablet Take 25 mg by mouth daily as needed (fluid/swelling).    [provider]  predniSONE (DELTASONE) 50 MG tablet Take 1 tablet (50 mg total) by mouth daily. 09/21/18   Dorie Rank, MD  tiotropium (SPIRIVA) 18 MCG inhalation capsule Place 18 mcg into inhaler and inhale daily.    [provider]  vitamin C (ASCORBIC ACID) 500 MG tablet Take 1,000 mg by mouth daily.    [provider]  vitamin E 400 UNIT capsule Take 400 Units by mouth daily.    [provider]    Family History No family history on file.  Social History Social History   Tobacco Use  . Smoking status: Current Some Day Smoker  Substance Use Topics  . Alcohol use: No  . Drug use: Not on file     Allergies   Lisinopril   Review of Systems Review of Systems  Respiratory: Positive for cough and shortness of breath.   All other systems reviewed and are  negative.    Physical Exam Updated Vital Signs BP (!) 110/48   Pulse 61   Temp (!) 97.4 F (36.3 C) (Oral)   Resp 19   SpO2 100%   Physical Exam Vitals signs and nursing note reviewed.  Constitutional:      General: He is not in acute distress.    Comments: Elderly, frail   HENT:     Head: Normocephalic and atraumatic.     Right Ear: External ear normal.     Left Ear: External ear normal.  Eyes:     General: No scleral icterus.       Right eye: No discharge.        Left eye: No discharge.     Conjunctiva/sclera: Conjunctivae normal.  Neck:     Musculoskeletal: Neck supple.     Trachea: No tracheal deviation.  Cardiovascular:     Rate and Rhythm: Normal rate and regular rhythm.  Pulmonary:     Effort: Pulmonary effort is normal. No respiratory distress.     Breath sounds: Normal breath sounds. No stridor. No  wheezing or rales.  Abdominal:     General: Bowel sounds are normal. There is no distension.     Palpations: Abdomen is soft.     Tenderness: There is no abdominal tenderness. There is no guarding or rebound.  Musculoskeletal:        General: No tenderness.  Skin:    General: Skin is warm and dry.     Findings: No rash.  Neurological:     Mental Status: He is alert.     Cranial Nerves: No cranial nerve deficit (no facial droop, extraocular movements intact, no slurred speech).     Sensory: No sensory deficit.     Motor: No abnormal muscle tone or seizure activity.     Coordination: Coordination normal.      ED Treatments / Results  Labs (all labs ordered are listed, but only abnormal results are displayed) Labs Reviewed  BASIC METABOLIC PANEL - Abnormal; Notable for the following components:      Result Value   CO2 20 (*)    Glucose, Bld 134 (*)    BUN 40 (*)    Creatinine, Ser 2.03 (*)    GFR calc non Af Amer 31 (*)    GFR calc Af Amer 36 (*)    All other components within normal limits  CBC - Abnormal; Notable for the following components:   RBC 3.31 (*)    Hemoglobin 10.4 (*)    HCT 32.5 (*)    Platelets 149 (*)    All other components within normal limits  BRAIN NATRIURETIC PEPTIDE - Abnormal; Notable for the following components:   B Natriuretic Peptide 321.1 (*)    All other components within normal limits    EKG EKG Interpretation  Date/Time:  Sunday September 21 2018 12:25:25 EST Ventricular Rate:  69 PR Interval:    QRS Duration: 176 QT Interval:  464 QTC Calculation: 497 R Axis:   -71 Text Interpretation:  Ventricular-paced rhythm Abnormal ECG No significant change since last tracing Confirmed by Dorie Rank [phone]) on 09/21/2018 12:57:17 PM   Radiology Dg Chest 2 View  Result Date: 09/21/2018 CLINICAL DATA:  78 year old male with 2 days of shortness of breath productive cough and congestion. EXAM: CHEST - 2 VIEW COMPARISON:  Chest radiographs  09/19/2015. FINDINGS: Stable lung volumes and mediastinal contours. Cardiac size at the upper limits of normal. Stable chronic left chest cardiac pacemaker.  Small bilateral pleural effusions in 2017 are regressed or resolved. Diffuse increased pulmonary interstitial markings appear stable. Chronic upper lobe scarring greater on the right. No pneumothorax, consolidation or areas of worsening ventilation. No acute osseous abnormality identified. Negative visible bowel gas pattern. IMPRESSION: 1. Diffuse pulmonary interstitial markings are stable compared to 2017. Top differential considerations include chronic interstitial lung disease, recurrent acute viral/atypical respiratory infection. Interstitial edema felt less likely. 2. Small pleural effusions in 2017 have regressed or resolved. 3. Borderline to mild cardiomegaly. Electronically Signed   By: Genevie Ann M.D.   On: 09/21/2018 14:14    Procedures Procedures (including critical care time)  Medications Ordered in ED Medications  sodium chloride flush (NS) 0.9 % injection 3 mL (3 mLs Intravenous Not Given 09/21/18 1312)  ipratropium-albuterol (DUONEB) 0.5-2.5 (3) MG/3ML nebulizer solution 3 mL (3 mLs Nebulization Given 09/21/18 1312)     Initial Impression / Assessment and Plan / ED Course  I have reviewed the triage vital signs and the nursing notes.  Pertinent labs & imaging results that were available during my care of the patient were reviewed by me and considered in my medical decision making (see chart for details).  Clinical Course as of Sep 21 1501  Sun Sep 21, 2018  1430 Elevated compared to prior however previous values several years ago.  No leukocytosis.  Chest x-ray without obvious pneumonia.   [JK]    Clinical Course User Index [JK] Dorie Rank, MD    Patient presented to the ED for evaluation of cough, shortness of breath and congestion.  ED work-up is reassuring.  No evidence of pneumonia.  Signs of pulmonary edema or recurrent  pleural effusion.  Patient's oxygen saturation has remained stable.  Patient does have known history of malignancy.  He has been coughing frequently.  I suspect he is having a component of bronchitis.  Plan on discharge home with supportive treatment.  Final Clinical Impressions(s) / ED Diagnoses   Final diagnoses:  Bronchitis    ED Discharge Orders         Ordered    doxycycline (VIBRAMYCIN) 100 MG capsule  2 times daily     09/21/18 1503    predniSONE (DELTASONE) 50 MG tablet  Daily     09/21/18 1503           Dorie Rank, MD 09/21/18 1503

## 2018-09-21 NOTE — ED Notes (Signed)
Patient verbalizes understanding of discharge instructions. Opportunity for questioning and answers were provided. Armband removed by staff, pt discharged from ED.  

## 2018-09-21 NOTE — ED Triage Notes (Signed)
Pt reports 2 days of increased SOB, cough and congestion. Pt states he checked his oxygen level at home and it was 87% on room air. Pt does not wear any oxygen. 93% in triage on room air. Denies any Chest pain, nad noted

## 2018-09-21 NOTE — ED Notes (Signed)
Ambulated pt while monitoring pulse oximetry. SpO2 stayed between 93% and 96%. Pt tolerated well.

## 2020-10-05 ENCOUNTER — Other Ambulatory Visit: Payer: Self-pay

## 2020-10-05 ENCOUNTER — Encounter (HOSPITAL_COMMUNITY): Payer: Self-pay | Admitting: Emergency Medicine

## 2020-10-05 ENCOUNTER — Emergency Department (HOSPITAL_COMMUNITY): Payer: No Typology Code available for payment source

## 2020-10-05 ENCOUNTER — Observation Stay (HOSPITAL_COMMUNITY)
Admission: EM | Admit: 2020-10-05 | Discharge: 2020-10-06 | Disposition: A | Discharge status: Home / Self Care | Payer: No Typology Code available for payment source | Attending: Family Medicine | Admitting: Family Medicine

## 2020-10-05 DIAGNOSIS — J449 Chronic obstructive pulmonary disease, unspecified: Secondary | ICD-10-CM | POA: Diagnosis not present

## 2020-10-05 DIAGNOSIS — G6282 Radiation-induced polyneuropathy: Secondary | ICD-10-CM | POA: Diagnosis not present

## 2020-10-05 DIAGNOSIS — R0602 Shortness of breath: Secondary | ICD-10-CM | POA: Diagnosis not present

## 2020-10-05 DIAGNOSIS — I495 Sick sinus syndrome: Secondary | ICD-10-CM | POA: Diagnosis not present

## 2020-10-05 DIAGNOSIS — Z79899 Other long term (current) drug therapy: Secondary | ICD-10-CM | POA: Diagnosis not present

## 2020-10-05 DIAGNOSIS — I509 Heart failure, unspecified: Secondary | ICD-10-CM

## 2020-10-05 DIAGNOSIS — N184 Chronic kidney disease, stage 4 (severe): Secondary | ICD-10-CM | POA: Diagnosis not present

## 2020-10-05 DIAGNOSIS — Z950 Presence of cardiac pacemaker: Secondary | ICD-10-CM | POA: Insufficient documentation

## 2020-10-05 DIAGNOSIS — I5023 Acute on chronic systolic (congestive) heart failure: Secondary | ICD-10-CM | POA: Diagnosis not present

## 2020-10-05 DIAGNOSIS — I110 Hypertensive heart disease with heart failure: Secondary | ICD-10-CM | POA: Insufficient documentation

## 2020-10-05 DIAGNOSIS — F17200 Nicotine dependence, unspecified, uncomplicated: Secondary | ICD-10-CM | POA: Diagnosis not present

## 2020-10-05 DIAGNOSIS — Z7901 Long term (current) use of anticoagulants: Secondary | ICD-10-CM | POA: Insufficient documentation

## 2020-10-05 DIAGNOSIS — I4821 Permanent atrial fibrillation: Secondary | ICD-10-CM | POA: Diagnosis not present

## 2020-10-05 DIAGNOSIS — Z20822 Contact with and (suspected) exposure to covid-19: Secondary | ICD-10-CM | POA: Diagnosis not present

## 2020-10-05 DIAGNOSIS — I5033 Acute on chronic diastolic (congestive) heart failure: Secondary | ICD-10-CM | POA: Diagnosis not present

## 2020-10-05 LAB — BASIC METABOLIC PANEL
Anion gap: 13 (ref 5–15)
BUN: 69 mg/dL — ABNORMAL HIGH (ref 8–23)
CO2: 22 mmol/L (ref 22–32)
Calcium: 9.4 mg/dL (ref 8.9–10.3)
Chloride: 99 mmol/L (ref 98–111)
Creatinine, Ser: 2.38 mg/dL — ABNORMAL HIGH (ref 0.61–1.24)
GFR, Estimated: 27 mL/min — ABNORMAL LOW (ref >60)
Glucose, Bld: 273 mg/dL — ABNORMAL HIGH (ref 70–99)
Potassium: 3.8 mmol/L (ref 3.5–5.1)
Sodium: 134 mmol/L — ABNORMAL LOW (ref 135–145)

## 2020-10-05 LAB — CBC
HCT: 30.5 % — ABNORMAL LOW (ref 39.0–52.0)
Hemoglobin: 9.9 g/dL — ABNORMAL LOW (ref 13.0–17.0)
MCH: 32.9 pg (ref 26.0–34.0)
MCHC: 32.5 g/dL (ref 30.0–36.0)
MCV: 101.3 fL — ABNORMAL HIGH (ref 80.0–100.0)
Platelets: 220 10*3/uL (ref 150–400)
RBC: 3.01 MIL/uL — ABNORMAL LOW (ref 4.22–5.81)
RDW: 15.9 % — ABNORMAL HIGH (ref 11.5–15.5)
WBC: 5.3 10*3/uL (ref 4.0–10.5)
nRBC: 0 % (ref 0.0–0.2)

## 2020-10-05 LAB — TROPONIN I (HIGH SENSITIVITY)
Troponin I (High Sensitivity): 29 ng/L — ABNORMAL HIGH (ref <18)
Troponin I (High Sensitivity): 28 ng/L — ABNORMAL HIGH (ref <18)

## 2020-10-05 LAB — RESP PANEL BY RT-PCR (FLU A&B, COVID) ARPGX2
Influenza A by PCR: NEGATIVE
Influenza B by PCR: NEGATIVE
SARS Coronavirus 2 by RT PCR: NEGATIVE

## 2020-10-05 LAB — BRAIN NATRIURETIC PEPTIDE: B Natriuretic Peptide: 288.3 pg/mL — ABNORMAL HIGH (ref 0.0–100.0)

## 2020-10-05 MED ORDER — ACETAMINOPHEN 325 MG PO TABS
650.0000 mg | ORAL_TABLET | Freq: Once | ORAL | Status: AC
  Administered 2020-10-05: 650 mg via ORAL
  Filled 2020-10-05: qty 2

## 2020-10-05 MED ORDER — FUROSEMIDE 10 MG/ML IJ SOLN
60.0000 mg | Freq: Once | INTRAMUSCULAR | Status: AC
  Administered 2020-10-05: 60 mg via INTRAVENOUS
  Filled 2020-10-05: qty 6

## 2020-10-05 MED ORDER — ROPINIROLE HCL 1 MG PO TABS
10.0000 mg | ORAL_TABLET | Freq: Every day | ORAL | Status: DC
  Filled 2020-10-05 (×2): qty 10

## 2020-10-05 MED ORDER — OXYCODONE HCL 5 MG PO TABS
5.0000 mg | ORAL_TABLET | Freq: Two times a day (BID) | ORAL | Status: DC | PRN
  Administered 2020-10-06 (×2): 5 mg via ORAL
  Filled 2020-10-05 (×2): qty 1

## 2020-10-05 MED ORDER — ACETAMINOPHEN 325 MG PO TABS: 650.0000 mg | ORAL_TABLET | Freq: Four times a day (QID) | ORAL | Status: DC | PRN

## 2020-10-05 MED ORDER — FAMOTIDINE 20 MG PO TABS: 20.0000 mg | ORAL_TABLET | Freq: Every evening | ORAL | Status: DC

## 2020-10-05 MED ORDER — FUROSEMIDE 10 MG/ML IJ SOLN
40.0000 mg | Freq: Two times a day (BID) | INTRAMUSCULAR | Status: DC
  Administered 2020-10-06 (×2): 40 mg via INTRAVENOUS
  Filled 2020-10-05 (×2): qty 4

## 2020-10-05 MED ORDER — APIXABAN 5 MG PO TABS
5.0000 mg | ORAL_TABLET | Freq: Two times a day (BID) | ORAL | Status: DC
  Administered 2020-10-06 (×2): 5 mg via ORAL
  Filled 2020-10-05 (×2): qty 1

## 2020-10-05 MED ORDER — ACETAMINOPHEN 650 MG RE SUPP: 650.0000 mg | Freq: Four times a day (QID) | RECTAL | Status: DC | PRN

## 2020-10-05 MED ORDER — TRAZODONE HCL 50 MG PO TABS
50.0000 mg | ORAL_TABLET | Freq: Every day | ORAL | Status: DC
  Administered 2020-10-06: 50 mg via ORAL
  Filled 2020-10-05: qty 1

## 2020-10-05 MED ORDER — IPRATROPIUM-ALBUTEROL 20-100 MCG/ACT IN AERS
1.0000 | INHALATION_SPRAY | Freq: Four times a day (QID) | RESPIRATORY_TRACT | Status: DC
  Administered 2020-10-06 (×3): 1 via RESPIRATORY_TRACT
  Filled 2020-10-05: qty 4

## 2020-10-05 MED ORDER — ALBUTEROL SULFATE HFA 108 (90 BASE) MCG/ACT IN AERS: 2.0000 | INHALATION_SPRAY | Freq: Four times a day (QID) | RESPIRATORY_TRACT | Status: DC | PRN

## 2020-10-05 MED ORDER — FUROSEMIDE 10 MG/ML IJ SOLN
INTRAMUSCULAR | Status: AC
  Filled 2020-10-05: qty 4

## 2020-10-05 MED ORDER — FENTANYL CITRATE (PF) 100 MCG/2ML IJ SOLN
25.0000 ug | Freq: Once | INTRAMUSCULAR | Status: DC
Start: 2020-10-05 — End: 2020-10-05

## 2020-10-05 MED ORDER — POLYETHYLENE GLYCOL 3350 17 G PO PACK: 17.0000 g | PACK | Freq: Every day | ORAL | Status: DC | PRN

## 2020-10-05 NOTE — H&P (Addendum)
[facility] Admission History and Physical Service Pager: [phone]  Patient name: Johnny Woods Medical record number: [id number] Date of birth: Jul 25, 1941 Age: 80 y.o. Gender: male  Primary Care Provider: [facility]: Cards Code Status: Full code, patient states "I need to live one more year" Preferred Emergency Contact: Arnette Felts, wife, [phone]  Chief Complaint: SOB  Assessment and Plan: Johnny Woods is a 80 y.o. male presenting with SOB. PMH is significant for CHF, a fib (s/p ablation), sick sinus syndrome (pacemaker in place) GERD, COPD, Non-small cell lung cancer (treated at [facility])   Dyspnea  CHF Patient presents with increased dyspnea at his oncologist appointment today and was advised to come to the ED. He reports increased shortness of breath and weakness with ambulation over the past month, and SOB while laying down and has been sitting up in a chair more because of it. Endorses typically using 1 pillow at night. He also reports over the past several days taking both Chlorthalidone 25mg  and HCTZ 25mg  because if his increased swelling, when he typically just takes Chlorthalidone. Patient's cardiologist is through the [place]. He has a history of CHF with last echo was 1 year ago, unsure of it's results. In ED, received 60mg  IV lasix, with 900 cc urine output since. BNP 288. Troponin flat at 29 >28. EKG showing junctional rhythm . Does have a pacer in place. CXR with chronic scarring, no acute intrathoracic process. On physical exam patient is volume overloaded, with 2+ pitting edema in BLE up to calf, and rales in lower lobes bilaterally (L>R), and presence of JVD. SOB likely due to fluid overload and progression of CHF. Can't rule out PE, as he is increased clot risk with his lung cancer history, although low likelyhood. Wells score of 1 and patient is currently on Eliquis 5mg  BID. Could have a respiratory illness in addition to his CHF as he  does have cough and congestion though more chronic in nature. COVID pending. Will admit patient for IV diuresis and strict monitoring of urine output. Cardiology consulted in the ED and will see patient tomorrow. -Admit to [facility], Med-tele, Attending Dr. Erin Hearing  - Cardiology consulted, appreciate recommendations  - S/p 1 dose 60mg  IV lasix with good response in ED. Patient does not take a loop diuretic at home. Due to this will reduce dose and diurese with 40mg  IV Lasix BID tomorrow, as he will likely have a good response with it as well. Will closely monitor urine output - Will get Echo - Continuous cardiac monitoring - Continuous pulse ox - Vitals per floor - Strict I's and O's - Daily weights - PT/OT evaluate and treat  AKI Cr. 2.38. On 09/21/18 was 2.03, and 09/19/15 was 1.60. Limited records since patient is seen at the [place]. Likely secondary to fluid overload as patient does have JVD to the angle of the jaw and pitting edema, in the setting of CHF exacerbation.  Will likely improve with diuresis. - Monitor closely as we plan to diurese -a.m. BMP  Anemia Hgb 9.9. Baseline appears to be around 11. Decrease from baseline likely dilutional in setting of fluid overload, however will continue to monitor. Anemia may be chronic in the setting of his malignancy. - AM CBC -can consider iron studies  HTN BP 151/47 on admission, must recently 139/60. Appears to be stable. Patient reports taking HCTZ on an as needed basis and recently was taking chlorthalidone as well as hydrochlorothiazide for an increase in lower  limb edema. - continue to monitor  - Will hold HCTZ and chlorthalidone tonight and can consider restarting cholorothalidone   Hyperglycemia Glucose 273 on admission. No record of diabetes.  No previous A1c in his chart. - will check Hgb A1c  - sliding scale insulin  Sick Sinus Syndrome  Patient has pacemaker in place since ~1997. - continuous cardiac monitoring   Atrial  fibrillation, chronic  Currently on Eliquis 5mg  BID. Hx of ablation. Pacemaker in place per above.  - continuous cardiac monitoring   Non-Small Cell Lung Cancer Stage 1A2 Per documentation from 12/21. Currently sees oncology. S/p chemotherapy and radiation, currently on immunotherapy every 6 weeks.   GERD Home med: Pepcid 20mg  daily  - continue home med   COPD Home med: Ipratropium-Albuterol 1 puff q 6 hours, Albuterol 2 puffs q6h prn  - continue home meds   Restless leg  leg and foot pain Home med: Ropinirole 10mg  at bedtine, Oxycodone 10mg  daily  - Tylenol 650 q 6h prn  - Oxy 5mg  BID prn   Tobacco Use disorder  Patient reports 30-35 pack year smoking history. Has decreased his current cigarette use to 5-6 daily  - can offer nicotine patch if needed   Constipation Reports occasional constipation, particularly when taking his pain medications. Last BM yesterday. Home med: Milk of magnesia  - Miralax prn   Urinary retention  BPH Patient reports taking tamsulosin 0.4mg  as needed. Has not used this in the past several weeks. -We will monitor urine output while diuresing  FEN/GI: heart healthy, carb modified diet  Prophylaxis: Eliquis 5mg  BID  Disposition: Med-tele   History of Present Illness:  Johnny Woods is a 80 y.o. male presenting with dyspnea. Hx a fib, HTN, CHF, lung cancer being treated by [place].  He states about a month ago he noted increased shortness of breath with ambulation. Went to [place] ED last week who he states did an Xray and then "they didn't do anything" and sent him home.   He has a history of lung cancer, diagnosed 2 yeas ago. S/p chemo and radiation and immunotherapy. He was at his oncologist office today with SOB and they recommended he come in. He notes that lately he gets short of breath when laying down, normally uses one pillow but lately has felt more short of breath and has needed to sit up in the chair. Sometimes wakes up 2-3x at night for that.    Denies CP, palpitations. HA. Does endorse occasionally seeing spots and blurry vision chronically for several years. Does report coughing up brown mucus. He also states he has a reduction in taste sensation. Denies hemoptysis, hematuria, or hematochezia.    Tobacco use 4-5 cigarettes per day, sometimes a bit more or less. Smoked off and on, but reports smoking in total for about 35 years, in the past did about 1 ppd  No alcohol, no drugs besides the prescribed oxycodone.  Of note, he had a steroid taper 2 months ago for concern of a COPD exaccerbation. He was given 5 for 5 days, 4 for 5 days, 3 for 5 days, 2 for 5 days, 1 for 5 days and then stop, he was off them for ~3 weeks and went back to the [place] 9 days ago who represcribed it. He finished the 2nd set of 5 for 5 days and was on the 4th day of 4 tabs per day. Spoke to pharmacy who did not recommend restarting it since he had been off of steroids  for a couple of weeks prior to restarting it and had been on less than 10 days.    Review Of Systems: Per HPI with the following additions:   Review of Systems  Constitutional: Negative for fever.  HENT: Positive for congestion.   Respiratory: Positive for cough and shortness of breath.   Cardiovascular: Positive for leg swelling. Negative for chest pain.  Gastrointestinal: Positive for abdominal pain (occasional with constipation) and constipation. Negative for blood in stool.  Genitourinary: Negative for difficulty urinating.  Neurological: Negative for headaches.     Patient Active Problem List   Diagnosis Date Noted  . CHF exacerbation ([place]) 10/05/2020  . CHF (congestive heart failure) ([place]) 10/05/2020    Past Medical History: Past Medical History:  Diagnosis Date  . Atrial fibrillation ([place])   . CHF (congestive heart failure) ([place])   . Hypertension     Past Surgical History: Past Surgical History:  Procedure Laterality Date  . ABLATION    . BACK SURGERY    . CHOLECYSTECTOMY     . PACEMAKER INSERTION  1997  . ruptured testicle      Social History: Social History   Tobacco Use  . Smoking status: Current Some Day Smoker  Substance Use Topics  . Alcohol use: No   Additional social history: Please also refer to relevant sections of EMR.  Family History: No family history on file.  Allergies and Medications: Allergies  Allergen Reactions  . Lisinopril     Angioedema   . Amlodipine Swelling    Swelling of ankles    No current facility-administered medications on file prior to encounter.   Current Outpatient Medications on File Prior to Encounter  Medication Sig Dispense Refill  . albuterol (PROVENTIL HFA;VENTOLIN HFA) 108 (90 Base) MCG/ACT inhaler Inhale 2 puffs into the lungs every 6 (six) hours as needed for wheezing or shortness of breath. 1 Inhaler 0  . apixaban (ELIQUIS) 5 MG TABS tablet Take 5 mg by mouth 2 (two) times daily.    . chlorthalidone (HYGROTON) 25 MG tablet Take 25 mg by mouth daily.    . famotidine (PEPCID) 20 MG tablet Take 20 mg by mouth every evening.    . hydrochlorothiazide (HYDRODIURIL) 25 MG tablet Take 25 mg by mouth daily as needed (fluid/swelling).    . Ipratropium-Albuterol (COMBIVENT RESPIMAT) 20-100 MCG/ACT AERS respimat Inhale 1 puff into the lungs every 6 (six) hours.    . Oxycodone HCl 10 MG TABS Take 10 mg by mouth daily as needed.    . ropinirole (REQUIP) 5 MG tablet Take 10 mg by mouth at bedtime.    . traZODone (DESYREL) 50 MG tablet Take 50 mg by mouth at bedtime.    . vitamin E 400 UNIT capsule Take 400 Units by mouth daily.      Objective: BP 139/60   Pulse 63   Temp (!) 97.4 F (36.3 C) (Oral)   Resp 20   SpO2 96%  Exam: General: alert, pleasant, NAD Eyes: Conjunctivae normal, EOMI ENTM: MMM Neck: Supple. JVD present to level of angle of the jaw  Cardiovascular: RRR, no murmurs Respiratory: Rales in lower lobes bilaterally (L>R). Normal WOB on RA  Gastrointestinal: soft, non distended  MSK: BLE  2+ pitting edema up to mid calf.  Derm: warm, dry Neuro: A&Ox4. No focal deficits  Psych: mood and affect normal  Labs and Imaging: CBC BMET  Recent Labs  Lab 10/05/20 1528  WBC 5.3  HGB 9.9*  HCT 30.5*  PLT  220   Recent Labs  Lab 10/05/20 1528  NA 134*  K 3.8  CL 99  CO2 22  BUN 69*  CREATININE 2.38*  GLUCOSE 273*  CALCIUM 9.4     EKG: junctional rhythm. Pacemaker in place  DG Chest 2 View  Result Date: 10/05/2020 CLINICAL DATA:  Short of breath and congestion for 1 week, tobacco abuse EXAM: CHEST - 2 VIEW COMPARISON:  09/21/2018 FINDINGS: Frontal and lateral views of the chest demonstrates stable dual lead pacemaker. Cardiac silhouette is unremarkable. Diffuse interstitial scarring is unchanged. No acute airspace disease, effusion, or pneumothorax. No acute bony abnormalities. IMPRESSION: 1. Chronic scarring, no acute intrathoracic process. Electronically Signed   By: Randa Ngo M.D.   On: 10/05/2020 15:56    Shary Key, DO 10/05/2020, 11:24 PM PGY-1, [facility] pager: [phone], text pages welcome  Upper Level Addendum:  I have seen and evaluated this patient along with Dr. Arby Barrette and reviewed the above note, making necessary revisions in green.   Lurline Del, DO 10/06/2020, 12:39 AM PGY-2, [place]

## 2020-10-05 NOTE — H&P (Incomplete)
[facility] Admission History and Physical Service Pager: [phone]  Patient name: Bion Todorov Cragle Medical record number: [id number] Date of birth: 12-27-1940 Age: 80 y.o. Gender: male  Primary Care Provider: [facility]: Cards Code Status: Full code, patient states "I need to live one more year" Preferred Emergency Contact: Arnette Felts, wife, [phone]  Chief Complaint: SOB  Assessment and Plan: Sven Pinheiro Tye is a 80 y.o. male presenting with SOB. PMH is significant for CHF, a fib (s/p ablation), GERD, COPD, lung cancer (treated at [facility])   Dyspnea  CHF Patient presents with increased dyspnea at his oncologist appointment today and was advised to come to the ED. He reports increased shortness of breath and weakness with ambulation over the past month, and SOB while laying down and has been sitting up in a chair more because of it. Endorses typically using 1 pillow at night. He also reports over the past several days taking both Chlorthalidone 25mg  and HCTZ 25mg  because if his increased swelling, when he typically just takes Chlorthalidone. Patient's cardiologist is through the [place]. Last echo was 1 year ago. In ED,  received 60mg  IV lasix. BNP 288. Troponin flat at 29 >28. EKG showing junctional rhythm . Does have a pacer in place. On physical exam patient volume overloaded, with 2+ pitting edema in BLE up to calf, and rales in lower lobes bilaterally (L>R), and presence of JVD  SOB likely due to fluid overload and progression of CHF. Can't rule out PE, as he is increased clot risk with his lung cancer. Wells score of 1. Currently on Eliquis  Admit for IV diuresis. Cardiology consulted and will see patient tomorrow.  -Admit to [place], Med-tele, Attending Dr. Erin Hearing  - Cardiology consult, appreciate recommendations  - s/p 1 dose lasix with good response in ED. Will diurese with 40 Lasix BID tomorrow, and will closely monitor urine output - Echo -  Continuous cardiac monitoring - Continuous pulse ox - Vitals per floor - Strict I's and O's - Daily weights - PT/OT evaluate and treat  AKI Cr. 2.38. On 09/21/18 was 2.03, and 09/19/15 was 1.60 Monitor closesly as we plan to diurese   Anemia Hgb 9.9. Baseline appears to be around 11.  HTN BP 151/47 . Patient reports taking HCTZ on an as needed bases  - continue to monitor   Hyperglycemia Glucose 273 on admission. No record of diabetes.  - will check Hgb A1c   Atrial fibrillation Currently on Eliquis 5mg  BID. Patient has pacemaker in place since ~1997. Hx of ablation.  Lung Cancer Stage 3 per patient. Currently being treated by oncologist at the [facility].   GERD Home med: Pepcid 20mg  daily   COPD Home med: Ipratropium-Albuterol 1 puff q 6 hours, Albuterol 2 puffs q6h prn  - continue home meds   Restless leg  leg and foot pain Home med: Ropinirole 10mg  at bedine, Oxycodone 10mg  daily  - Tylenol 650 q 6h prn  - Oxy 5mg  BID prn   Tobacco Use disorder  Patient reports 30-35 pack year smoking history. Has decreased his current cigarette use to 5-6 daily  - can offer nicotine patch if needed   Constipation Reports occasional constipation, particularly when taking his pain medications. Last BM yesterday. Home med: Milk of magnesia  - Miralax prn   Urinary retention Patient reports taking tamsulosin 0.4*** as needed. Has not used this in the past several weeks.   FEN/GI: heart healthy, carb modified diet  Prophylaxis: Eliquis  5mg  BID  Disposition: Med-tele   History of Present Illness:  Merlen Gurry Veldhuizen is a 80 y.o. male presenting with dyspnea. Hx a fib, HTN, CHF, lung cancer being treated by [place].  He states about a month ago he noted increased shortness of breath with ambulation. Went to [place] ED last week who he states did an Xray and then "they didn't do anything" and sent him home.   He has stage 3 lung cancer per him, diagnosed 2 yeas ago. S/p chemo and radiation and  immunotherapy. He was at his oncologist office today with SOB and they recommended he come in. He notes that lately he gets short of breath when laying down, normally uses one pillow but lately has felt more short of breath and has needed to sit up in the chair. Sometimes wakes up 2-3x at night for that.   Denies CP, palpitations. HA. Does endorse occasionally seeing spots and blurry vision chronically for several years. Does rpHe states he has a reduction in taste sensation.    Tobacco use 4-5 cigarettes per day, sometimes a bit more or less. Smoked off and on for about 35 years, in the past did about 1 ppd  No alcohol, no drugs besides the prescribed oxycodone.  Of note, he had a steroid taper 2 months ago, 5 for 5 days, 4 for 5 days, 3 for 5 days, 2 for 5 days, 1 for 5 days and then stop, he was off them for ~3 weeks and went back to the [place] 9 days ago who represcribed it. He finished the 2nd set of 5 for 5 days and is on the 4th day of 4 tabs per day. Spoke to pharmacy who did not recommend restarting it since he had been off of steroids for a couple of weeks.    Review Of Systems: Per HPI with the following additions:   Review of Systems  Constitutional: Negative for fever.  HENT: Positive for congestion.   Respiratory: Positive for cough and shortness of breath.   Cardiovascular: Positive for leg swelling. Negative for chest pain.  Gastrointestinal: Positive for abdominal pain (occasional with constipation) and constipation. Negative for blood in stool.  Genitourinary: Negative for difficulty urinating.  Neurological: Negative for headaches.     Patient Active Problem List   Diagnosis Date Noted  . CHF exacerbation ([place]) 10/05/2020  . CHF (congestive heart failure) ([place]) 10/05/2020    Past Medical History: Past Medical History:  Diagnosis Date  . Atrial fibrillation ([place])   . CHF (congestive heart failure) ([place])   . Hypertension     Past Surgical History: Past Surgical  History:  Procedure Laterality Date  . ABLATION    . BACK SURGERY    . CHOLECYSTECTOMY    . PACEMAKER INSERTION  1997  . ruptured testicle      Social History: Social History   Tobacco Use  . Smoking status: Current Some Day Smoker  Substance Use Topics  . Alcohol use: No   Additional social history: Please also refer to relevant sections of EMR.  Family History: No family history on file.  Allergies and Medications: Allergies  Allergen Reactions  . Lisinopril     Angioedema   . Amlodipine Swelling    Swelling of ankles    No current facility-administered medications on file prior to encounter.   Current Outpatient Medications on File Prior to Encounter  Medication Sig Dispense Refill  . albuterol (PROVENTIL HFA;VENTOLIN HFA) 108 (90 Base) MCG/ACT inhaler Inhale 2  puffs into the lungs every 6 (six) hours as needed for wheezing or shortness of breath. 1 Inhaler 0  . apixaban (ELIQUIS) 5 MG TABS tablet Take 5 mg by mouth 2 (two) times daily.    . chlorthalidone (HYGROTON) 25 MG tablet Take 25 mg by mouth daily.    . famotidine (PEPCID) 20 MG tablet Take 20 mg by mouth every evening.    . hydrochlorothiazide (HYDRODIURIL) 25 MG tablet Take 25 mg by mouth daily as needed (fluid/swelling).    . Ipratropium-Albuterol (COMBIVENT RESPIMAT) 20-100 MCG/ACT AERS respimat Inhale 1 puff into the lungs every 6 (six) hours.    . Oxycodone HCl 10 MG TABS Take 10 mg by mouth daily as needed.    . ropinirole (REQUIP) 5 MG tablet Take 10 mg by mouth at bedtime.    . traZODone (DESYREL) 50 MG tablet Take 50 mg by mouth at bedtime.    . vitamin E 400 UNIT capsule Take 400 Units by mouth daily.      Objective: BP 139/60   Pulse 63   Temp (!) 97.4 F (36.3 C) (Oral)   Resp 20   SpO2 96%  Exam: General: alert, pleasant, NAD Eyes: Conjunctivae normal, EOMI ENTM: MMM Neck: Supple. JVD present to level of angle of the jaw  Cardiovascular: RRR, no murmurs Respiratory: Rales in lower  lobes bilaterally (L>R). Normal WOB on RA  Gastrointestinal: soft, non distended  MSK: BLE 2+ pitting edema up to mid calf.  Derm: warm, dry Neuro: A&Ox4. No focal deficits  Psych: mood and affect normal  Labs and Imaging: CBC BMET  Recent Labs  Lab 10/05/20 1528  WBC 5.3  HGB 9.9*  HCT 30.5*  PLT 220   Recent Labs  Lab 10/05/20 1528  NA 134*  K 3.8  CL 99  CO2 22  BUN 69*  CREATININE 2.38*  GLUCOSE 273*  CALCIUM 9.4     EKG: junctional rhythm. Pacemaker in place  DG Chest 2 View  Result Date: 10/05/2020 CLINICAL DATA:  Short of breath and congestion for 1 week, tobacco abuse EXAM: CHEST - 2 VIEW COMPARISON:  09/21/2018 FINDINGS: Frontal and lateral views of the chest demonstrates stable dual lead pacemaker. Cardiac silhouette is unremarkable. Diffuse interstitial scarring is unchanged. No acute airspace disease, effusion, or pneumothorax. No acute bony abnormalities. IMPRESSION: 1. Chronic scarring, no acute intrathoracic process. Electronically Signed   By: Randa Ngo M.D.   On: 10/05/2020 15:56    Shary Key, DO 10/05/2020, 11:24 PM PGY-1, [place] Intern pager: [phone], text pages welcome

## 2020-10-05 NOTE — ED Provider Notes (Signed)
[facility] EMERGENCY DEPARTMENT Provider Note   CSN: [id number] Arrival date & time: 10/05/20  1518     History Chief Complaint  Patient presents with  . Shortness of Breath    Johnny Woods is a 80 y.o. male.  HPI 80 year old male with a history of atrial fibrillation, CHF, hypertension, lung cancer being chemotherapy, followed by the [place], presents to the ER with worsening shortness of breath over the last month.  He reports compliance with his medications.  States his cardiologist is through the [place].  He reports that he was sent here by the [facility], was being seen by his oncologist today.  He feels weak, short of breath even with the smallest tasks.  Has felt like his legs are swollen as well.  He is not exactly sure why he was sent here to the ER, triage notes note new onset heart failure but patient reports and our records show that he does have a diagnosis of heart failure.  Patient reports that his last echo was approximately a year ago.  Denies any abdominal pain, nausea, vomiting, chest pain, syncope.    Past Medical History:  Diagnosis Date  . Atrial fibrillation ([place])   . CHF (congestive heart failure) ([place])   . Hypertension     Patient Active Problem List   Diagnosis Date Noted  . CHF exacerbation ([place]) 10/05/2020    Past Surgical History:  Procedure Laterality Date  . ABLATION    . BACK SURGERY    . CHOLECYSTECTOMY    . PACEMAKER INSERTION  1997  . ruptured testicle         No family history on file.  Social History   Tobacco Use  . Smoking status: Current Some Day Smoker  Substance Use Topics  . Alcohol use: No    Home Medications Prior to Admission medications   Medication Sig Start Date End Date Taking? Authorizing Provider  albuterol (PROVENTIL HFA;VENTOLIN HFA) 108 (90 Base) MCG/ACT inhaler Inhale 2 puffs into the lungs every 6 (six) hours as needed for wheezing or shortness of breath. 09/20/15  Yes Horton, Barbette Hair, MD  apixaban  (ELIQUIS) 5 MG TABS tablet Take 5 mg by mouth 2 (two) times daily.   Yes [provider]  chlorthalidone (HYGROTON) 25 MG tablet Take 25 mg by mouth daily. 04/15/19  Yes [provider]  famotidine (PEPCID) 20 MG tablet Take 20 mg by mouth every evening.   Yes [provider]  hydrochlorothiazide (HYDRODIURIL) 25 MG tablet Take 25 mg by mouth daily as needed (fluid/swelling).   Yes [provider]  Ipratropium-Albuterol (COMBIVENT RESPIMAT) 20-100 MCG/ACT AERS respimat Inhale 1 puff into the lungs every 6 (six) hours.   Yes [provider]  Oxycodone HCl 10 MG TABS Take 10 mg by mouth daily as needed.   Yes [provider]  ropinirole (REQUIP) 5 MG tablet Take 10 mg by mouth at bedtime.   Yes [provider]  traZODone (DESYREL) 50 MG tablet Take 50 mg by mouth at bedtime.   Yes [provider]  vitamin E 400 UNIT capsule Take 400 Units by mouth daily.   Yes [provider]    Allergies    Lisinopril and Amlodipine  Review of Systems   Review of Systems  Constitutional: Negative for chills and fever.  HENT: Negative for ear pain and sore throat.   Eyes: Negative for pain and visual disturbance.  Respiratory: Positive for shortness of breath. Negative for  cough.   Cardiovascular: Positive for leg swelling. Negative for chest pain and palpitations.  Gastrointestinal: Negative for abdominal pain and vomiting.  Genitourinary: Negative for dysuria and hematuria.  Musculoskeletal: Negative for arthralgias and back pain.  Skin: Negative for color change and rash.  Neurological: Negative for seizures and syncope.  All other systems reviewed and are negative.   Physical Exam Updated Vital Signs BP 137/63   Pulse 60   Temp (!) 97.4 F (36.3 C) (Oral)   Resp 20   SpO2 96%   Physical Exam Vitals and nursing note reviewed.  Constitutional:      General: He is not in acute distress.    Appearance: He is  well-developed and well-nourished. He is not ill-appearing, toxic-appearing or diaphoretic.  HENT:     Head: Normocephalic and atraumatic.  Eyes:     Conjunctiva/sclera: Conjunctivae normal.  Cardiovascular:     Rate and Rhythm: Normal rate and regular rhythm.     Heart sounds: No murmur heard.   Pulmonary:     Effort: Pulmonary effort is normal. No respiratory distress.     Breath sounds: Examination of the right-lower field reveals rales. Examination of the left-lower field reveals rales. Rales present.  Chest:     Chest wall: No tenderness.  Abdominal:     Palpations: Abdomen is soft.     Tenderness: There is no abdominal tenderness.  Musculoskeletal:     Cervical back: Neck supple.     Right lower leg: No tenderness. Edema present.     Left lower leg: No tenderness. Edema present.     Comments: 2+ pitting edema to LE bilat   Skin:    General: Skin is warm and dry.  Neurological:     Mental Status: He is alert.  Psychiatric:        Mood and Affect: Mood and affect normal.     ED Results / Procedures / Treatments   Labs (all labs ordered are listed, but only abnormal results are displayed) Labs Reviewed  BASIC METABOLIC PANEL - Abnormal; Notable for the following components:      Result Value   Sodium 134 (*)    Glucose, Bld 273 (*)    BUN 69 (*)    Creatinine, Ser 2.38 (*)    GFR, Estimated 27 (*)    All other components within normal limits  CBC - Abnormal; Notable for the following components:   RBC 3.01 (*)    Hemoglobin 9.9 (*)    HCT 30.5 (*)    MCV 101.3 (*)    RDW 15.9 (*)    All other components within normal limits  BRAIN NATRIURETIC PEPTIDE - Abnormal; Notable for the following components:   B Natriuretic Peptide 288.3 (*)    All other components within normal limits  TROPONIN I (HIGH SENSITIVITY) - Abnormal; Notable for the following components:   Troponin I (High Sensitivity) 29 (*)    All other components within normal limits  TROPONIN I (HIGH  SENSITIVITY) - Abnormal; Notable for the following components:   Troponin I (High Sensitivity) 28 (*)    All other components within normal limits  RESP PANEL BY RT-PCR (FLU A&B, COVID) [id number]    EKG None  Radiology DG Chest 2 View  Result Date: 10/05/2020 CLINICAL DATA:  Short of breath and congestion for 1 week, tobacco abuse EXAM: CHEST - 2 VIEW COMPARISON:  09/21/2018 FINDINGS: Frontal and lateral views of the chest demonstrates stable dual lead pacemaker. Cardiac silhouette is  unremarkable. Diffuse interstitial scarring is unchanged. No acute airspace disease, effusion, or pneumothorax. No acute bony abnormalities. IMPRESSION: 1. Chronic scarring, no acute intrathoracic process. Electronically Signed   By: Randa Ngo M.D.   On: 10/05/2020 15:56    Procedures Procedures   Medications Ordered in ED Medications  furosemide (LASIX) 10 MG/ML injection (has no administration in time range)  furosemide (LASIX) injection 60 mg (60 mg Intravenous Given 10/05/20 1855)  acetaminophen (TYLENOL) tablet 650 mg (650 mg Oral Given 10/05/20 2110)    ED Course  I have reviewed the triage vital signs and the nursing notes.  Pertinent labs & imaging results that were available during my care of the patient were reviewed by me and considered in my medical decision making (see chart for details).    MDM Rules/Calculators/A&P                          80 year old male presents to the ER with shortness of breath On arrival, the patient is chronically ill-appearing, however in no acute distress, resting comfortably in the ER bed.  Vitals overall with a elevated blood pressure 151/47, no evidence of hypoxia, tachycardia or tachypnea.  On exam, he does appear fluid overloaded, with 2+ pitting edema to his lower extremities bilaterally, and some rales to the lower lobes bilaterally.  Unfortunately due to the patient receiving most of his care through the [place], record review is very limited.   Labs  ordered, reviewed and interpreted by myself  -CBC without leukocytosis, a hemoglobin of 9.9, slightly decreased from values 2 years ago but overall stable.  Mild hyponatremia noted on BMP, creatinine of 2.38 which is more or less at baseline.  GFR 27.  Delta troponins elevated at 28 and 29 respectively, but flat.  BNP mildly elevated to 88.3.  Chest x-ray ordered, reviewed and interpreted by me -No evidence of pneumonia, with some chronic scarring.  No overt evidence of fluid overload  MDM: Patient received 60 mg of Lasix here in the ED.  Requesting Tylenol for neuropathy in his feet, not on any neuropathic medicines.  Patient was ambulated by nursing staff, no evidence of hypoxia but was reportedly very short of breath on ambulation  Consulted Dr. Terri Skains with cardiology to discuss admission versus possibly heart failure at home.  He reports that given that the patient is not established with any of her cardiology groups in the area, it would be difficult to arrange this.  He recommends admission for IV diuresis.  Patient is agreeable to this plan.  This was a shared visit with my supervising physician Dr. Roslynn Amble who independently saw and evaluated the patient & provided guidance in evaluation/management/disposition ,in agreement with care  final Clinical Impression(s) / ED Diagnoses Final diagnoses:  Acute on chronic congestive heart failure, unspecified heart failure type [facility])    Rx / DC Orders ED Discharge Orders    None       Lyndel Safe 10/05/20 2221    Lucrezia Starch, MD 10/07/20 [phone]

## 2020-10-05 NOTE — ED Notes (Signed)
This NT ambulated pt while checking pulse oximetry. Pt oxygen saturation started at 98% while sitting, and dropped to 94-96% while ambulating. Pt stated he only felt slightly short of breath while ambulating and reported no dizziness.

## 2020-10-05 NOTE — ED Notes (Signed)
Pt given PO fluids - 267ml- [place] sandwich and a cup of applesauce.

## 2020-10-05 NOTE — ED Triage Notes (Signed)
Patient sent from [facility] for evaluation of shortness of breath related to undiagnosed congestive heart failure. Patient alert, oriented, and in no apparent distress at this time.

## 2020-10-06 ENCOUNTER — Observation Stay (HOSPITAL_BASED_OUTPATIENT_CLINIC_OR_DEPARTMENT_OTHER): Payer: No Typology Code available for payment source

## 2020-10-06 DIAGNOSIS — I4821 Permanent atrial fibrillation: Secondary | ICD-10-CM | POA: Diagnosis not present

## 2020-10-06 DIAGNOSIS — I480 Paroxysmal atrial fibrillation: Secondary | ICD-10-CM

## 2020-10-06 DIAGNOSIS — G6282 Radiation-induced polyneuropathy: Secondary | ICD-10-CM | POA: Diagnosis not present

## 2020-10-06 DIAGNOSIS — I495 Sick sinus syndrome: Secondary | ICD-10-CM | POA: Diagnosis not present

## 2020-10-06 DIAGNOSIS — I509 Heart failure, unspecified: Secondary | ICD-10-CM

## 2020-10-06 DIAGNOSIS — I5033 Acute on chronic diastolic (congestive) heart failure: Secondary | ICD-10-CM | POA: Diagnosis not present

## 2020-10-06 DIAGNOSIS — N184 Chronic kidney disease, stage 4 (severe): Secondary | ICD-10-CM | POA: Diagnosis not present

## 2020-10-06 LAB — COMPREHENSIVE METABOLIC PANEL
ALT: 40 U/L (ref 0–44)
AST: 20 U/L (ref 15–41)
Albumin: 3 g/dL — ABNORMAL LOW (ref 3.5–5.0)
Alkaline Phosphatase: 73 U/L (ref 38–126)
Anion gap: 13 (ref 5–15)
BUN: 69 mg/dL — ABNORMAL HIGH (ref 8–23)
CO2: 22 mmol/L (ref 22–32)
Calcium: 9.1 mg/dL (ref 8.9–10.3)
Chloride: 98 mmol/L (ref 98–111)
Creatinine, Ser: 2.39 mg/dL — ABNORMAL HIGH (ref 0.61–1.24)
GFR, Estimated: 27 mL/min — ABNORMAL LOW (ref >60)
Glucose, Bld: 385 mg/dL — ABNORMAL HIGH (ref 70–99)
Potassium: 3.8 mmol/L (ref 3.5–5.1)
Sodium: 133 mmol/L — ABNORMAL LOW (ref 135–145)
Total Bilirubin: 1.3 mg/dL — ABNORMAL HIGH (ref 0.3–1.2)
Total Protein: 5.4 g/dL — ABNORMAL LOW (ref 6.5–8.1)

## 2020-10-06 LAB — CBC
HCT: 27.5 % — ABNORMAL LOW (ref 39.0–52.0)
Hemoglobin: 8.9 g/dL — ABNORMAL LOW (ref 13.0–17.0)
MCH: 32.6 pg (ref 26.0–34.0)
MCHC: 32.4 g/dL (ref 30.0–36.0)
MCV: 100.7 fL — ABNORMAL HIGH (ref 80.0–100.0)
Platelets: 181 10*3/uL (ref 150–400)
RBC: 2.73 MIL/uL — ABNORMAL LOW (ref 4.22–5.81)
RDW: 15.9 % — ABNORMAL HIGH (ref 11.5–15.5)
WBC: 2.8 10*3/uL — ABNORMAL LOW (ref 4.0–10.5)
nRBC: 0 % (ref 0.0–0.2)

## 2020-10-06 LAB — LIPID PANEL
Cholesterol: 156 mg/dL (ref 0–200)
HDL: 61 mg/dL (ref >40)
LDL Cholesterol: 78 mg/dL (ref 0–99)
Total CHOL/HDL Ratio: 2.6 RATIO
Triglycerides: 86 mg/dL (ref <150)
VLDL: 17 mg/dL (ref 0–40)

## 2020-10-06 LAB — CBG MONITORING, ED
Glucose-Capillary: 289 mg/dL — ABNORMAL HIGH (ref 70–99)
Glucose-Capillary: 125 mg/dL — ABNORMAL HIGH (ref 70–99)

## 2020-10-06 LAB — ECHOCARDIOGRAM COMPLETE
Area-P 1/2: 3.65 cm2
MV M vel: 4.29 m/s
MV Peak grad: 73.6 mmHg
Radius: 0.4 cm
S' Lateral: 3.1 cm

## 2020-10-06 LAB — TSH: TSH: 1.468 u[IU]/mL (ref 0.350–4.500)

## 2020-10-06 LAB — HEMOGLOBIN A1C
Hgb A1c MFr Bld: 8.4 % — ABNORMAL HIGH (ref 4.8–5.6)
Mean Plasma Glucose: 194.38 mg/dL

## 2020-10-06 MED ORDER — POLYETHYLENE GLYCOL 3350 17 G PO PACK: 17.0000 g | PACK | Freq: Every day | ORAL | 0 refills | Status: DC | PRN

## 2020-10-06 MED ORDER — FUROSEMIDE 40 MG PO TABS: 40.0000 mg | ORAL_TABLET | Freq: Every day | ORAL | 0 refills | Status: AC

## 2020-10-06 MED ORDER — INSULIN ASPART 100 UNIT/ML ~~LOC~~ SOLN
0.0000 [IU] | Freq: Three times a day (TID) | SUBCUTANEOUS | Status: DC
  Administered 2020-10-06: 5 [IU] via SUBCUTANEOUS

## 2020-10-06 NOTE — Discharge Planning (Signed)
Pt active at [facility] MD: Amalia Greenhouse SW: Janora Norlander   Pager: [phone] Desk phone: [phone]

## 2020-10-06 NOTE — Discharge Summary (Signed)
[facility] Discharge Summary  Patient name: Johnny Woods Medical record number: [id number] Date of birth: 1940/11/16 Age: 80 y.o. Gender: male Date of Admission: 10/05/2020  Date of Discharge: 10/06/2020 Admitting Physician: Lind Covert, MD  Primary Care Provider: [place] Consultants: Cardiology  Indication for Hospitalization: shortness of breath  Discharge Diagnoses/Problem List:  CHF exacerbation AKI Anemia HTN Sick sinus syndrome  Atrial fibrillation  Non-small cell lung cancer GERD  Disposition: home  Discharge Condition: medically stable  Discharge Exam:  Temp:  [97.4 F (36.3 C)-97.7 F (36.5 C)] 97.5 F (36.4 C) (02/24 0125) Pulse Rate:  [46-104] 69 (02/24 0915) Resp:  [12-25] 16 (02/24 0915) BP: (116-168)/(47-82) 132/57 (02/24 0915) SpO2:  [93 %-99 %] 98 % (02/24 0915) Physical Exam: General: Patient sitting upright in bed eating breakfast, in no acute distress. Cardiovascular: irregularly irregular, no murmurs or gallops auscultated, presence of JVD Respiratory: faint crackles noted more prominently along lower lobes bilaterally, breathing comfortably on room air Abdomen: soft, nontender, presence of active bowel sounds Extremities: radial and distal pulses strong and equal bilaterally, no LE edema noted bilaterally Neuro: AOx4  Brief Hospital Course:  Johnny Asbridge Rorreris a 80 y.o.malepresenting with SOB. PMH is significant forCHF, a fib (s/p ablation), sick sinus syndrome (pacemaker in place) GERD, COPD and non-small cell lung cancer (treated at [facility]).  Acute on chronic CHF Patient presented with worsening dyspnea at his most recent oncology outpatient appointment and advised to come to the ED. Dyspnea likely due to heart failure exacerbation in the setting of prior history of CHF. Notable labs on admission include BNP 288, troponin 29>28 and EKG notable for junctional rhythm. CXR demonstrates chronic scarring  without no acute intrathoracic process. On admission, noted to be hypervolemic on exam. Cardiology consulted initially given concern for possibly new onset, although patient has a prior history. In the ED, received IV lasix 60 mg initially then IV lasix 40 mg bid. Appropriate diuresis given with intake and output monitored throughout hospitalization. Echo notable for EF 60-65% with normal LV function consistent with Grade 3 diastolic dysfunction. PT worked with patient and recommended outpatient physical therapy which was ordered upon discharge. Patient was euvolemic with resolution of symptoms well prior to discharge. Patient discharged on lasix 40 mg daily. Cardiology recommended and scheduled outpatient follow up.    All other conditions chronic and stable.    Issues for Follow Up:  1. Recommended to start on farxiga, A1c 8.4, recommend repeat in 3 months. Monitor renal function during treatment.  2. Consider repeat BMP, Cr 2.39 on admission. Monitor renal function. 3. Ensure patient follows up with cardiology outpatient, will likely start on bidil at this time. Reassess HCTZ, it was not restarted on discharge due to high Cr.  4. Discharged with outpatient PT, encourage gradual physical activity to improve strength. 5. Ensure that patient continues to follow up with oncology outpatient for non-small cell lung cancer.  6. Encourage smoking cessation.    Significant Procedures:  2/24: Echo performed which demonstrated EF 60-65% with normal LV function consistent with Grade 3 diastolic dysfunction.   Significant Labs and Imaging:  Recent Labs  Lab 10/05/20 1528 10/06/20 0224  WBC 5.3 2.8*  HGB 9.9* 8.9*  HCT 30.5* 27.5*  PLT 220 181   Recent Labs  Lab 10/05/20 1528 10/06/20 0224  NA 134* 133*  K 3.8 3.8  CL 99 98  CO2 22 22  GLUCOSE 273* 385*  BUN 69* 69*  CREATININE 2.38*  2.39*  CALCIUM 9.4 9.1  ALKPHOS  --  73  AST  --  20  ALT  --  40  ALBUMIN  --  3.0*       Results/Tests Pending at Time of Discharge:  none  Discharge Medications:  Allergies as of 10/06/2020      Reactions   Lisinopril Swelling, Other (See Comments)   Angioedema   Ace Inhibitors Swelling, Other (See Comments)   "Angioedema"   Amlodipine Swelling, Other (See Comments)   Swelling of ankles and lower limbs      Medication List    STOP taking these medications   chlorthalidone 25 MG tablet Commonly known as: HYGROTON   Combivent Respimat 20-100 MCG/ACT Aers respimat Generic drug: Ipratropium-Albuterol   hydrochlorothiazide 25 MG tablet Commonly known as: HYDRODIURIL   predniSONE 10 MG tablet Commonly known as: DELTASONE     TAKE these medications   acetaminophen 500 MG tablet Commonly known as: TYLENOL Take 1,000 mg by mouth every 8 (eight) hours as needed for mild pain.   albuterol 108 (90 Base) MCG/ACT inhaler Commonly known as: VENTOLIN HFA Inhale 2 puffs into the lungs every 6 (six) hours as needed for wheezing or shortness of breath.   apixaban 5 MG Tabs tablet Commonly known as: ELIQUIS Take 5 mg by mouth 2 (two) times daily.   famotidine 20 MG tablet Commonly known as: PEPCID Take 20 mg by mouth at bedtime.   furosemide 40 MG tablet Commonly known as: Lasix Take 1 tablet (40 mg total) by mouth daily.   Oxycodone HCl 10 MG Tabs Take 5-10 mg by mouth daily as needed (for pain).   polyethylene glycol 17 g packet Commonly known as: MIRALAX / GLYCOLAX Take 17 g by mouth daily as needed for mild constipation.   rOPINIRole 0.5 MG tablet Commonly known as: REQUIP Take 1 mg by mouth at bedtime.   senna 8.6 MG Tabs tablet Commonly known as: SENOKOT Take 2 tablets by mouth 2 (two) times daily as needed for mild constipation.   sildenafil 100 MG tablet Commonly known as: VIAGRA Take 100 mg by mouth daily as needed for erectile dysfunction.   Stiolto Respimat 2.5-2.5 MCG/ACT Aers Generic drug: Tiotropium Bromide-Olodaterol Inhale 2  puffs into the lungs in the morning.   tamsulosin 0.4 MG Caps capsule Commonly known as: FLOMAX Take 0.8 mg by mouth at bedtime.   traZODone 50 MG tablet Commonly known as: DESYREL Take 25-50 mg by mouth at bedtime as needed for sleep.   Vitamin D3 50 MCG (2000 UT) Tabs Take 2,000 microcuries/1.47m2 by mouth daily.   vitamin E 180 MG (400 UNITS) capsule Take 400 Units by mouth daily.            Durable Medical Equipment  (From admission, onward)         Start     Ordered   10/06/20 1118  For home use only DME Walker rolling  Once       Question Answer Comment  Walker: With 5 Inch Wheels   Patient needs a walker to treat with the following condition Leg weakness, bilateral      10/06/20 1117          Discharge Instructions: Please refer to Patient Instructions section of EMR for full details.  Patient was counseled important signs and symptoms that should prompt return to medical care, changes in medications, dietary instructions, activity restrictions, and follow up appointments.   Follow-Up Appointments:  Follow-up Information  Adrian Prows, MD Follow up on 10/17/2020.   Specialty: Cardiology Why: 12:00 (Noon). Bring all medications and [facility] Contact information: [place] [address] [phone]        Donney Dice, DO. Go on 10/19/2020.   Specialty: Family Medicine Why: Your next appointment is scheduled for 10/19/2020 at 11:25 am. Please arrive 15 minutes early. Contact information: [place] [address] [phone]               Donney Dice, DO 10/06/2020, 3:20 PM PGY-1, [place]

## 2020-10-06 NOTE — ED Notes (Signed)
Directed Diabetes coordinator to pt room in [place].

## 2020-10-06 NOTE — Consult Note (Addendum)
CARDIOLOGY CONSULT NOTE  Patient ID: Johnny Woods MRN: [id number] DOB/AGE: 79-Feb-1942 79 y.o.  Admit date: 10/05/2020 Referring Physician  Ruthann Cancer L. Chambliss Primary Physician:  [place] Reason for Consultation  CHF  Patient ID: Johnny Woods, male    DOB: 30-Oct-1940, 80 y.o.   MRN: [id number]  Chief Complaint  Patient presents with  . Shortness of Breath   HPI:    Johnny Woods  is a 80 y.o. with permanent atrial fibrillation, chronic diastolic heart failure, hypertension, stage III lung cancer SP radiation therapy and chemotherapy, presently undergoing immunotherapy, who has been having chronic lower extremity weakness, cramping in his legs, cramping in his hands, chronic dyspnea on exertion, presented to the emergency room with worsening dyspnea, leg edema.  Was found to be in acute decompensated heart failure, was started on diuretics.  We were requested to consult on the patient to manage heart failure and cardiac issues.  This morning patient is feeling better, dyspnea has improved.  Leg edema has completely resolved.  Denies chest pain or palpitations.  Main complaint is generalized weakness and especially weakness in his lower extremity and states that he is unable to stand up.  Request physical therapy.  Past Medical History:  Diagnosis Date  . Atrial fibrillation ([place])   . CHF (congestive heart failure) ([place])   . Hypertension    Past Surgical History:  Procedure Laterality Date  . ABLATION    . BACK SURGERY    . CHOLECYSTECTOMY    . PACEMAKER INSERTION  1997  . ruptured testicle     Social History   Tobacco Use  . Smoking status: Current Some Day Smoker  . Smokeless tobacco: Not on file  Substance Use Topics  . Alcohol use: No    No family history on file.  Marital Sttus: Married  ROS  Review of Systems  Constitutional: Positive for malaise/fatigue.  Cardiovascular: Positive for dyspnea on exertion, leg swelling, orthopnea and paroxysmal  nocturnal dyspnea. Negative for chest pain and palpitations.  Musculoskeletal: Positive for back pain, muscle cramps and muscle weakness.  Gastrointestinal: Negative.  Negative for melena.  Genitourinary: Negative.   Neurological: Positive for numbness and paresthesias.  All other systems reviewed and are negative.  Objective   Vitals with BMI 10/06/2020 10/06/2020 10/06/2020  Height - - -  Weight - - -  BMI - - -  Systolic 517 616 073  Diastolic 57 62 63  Pulse 69 46 57    Blood pressure (!) 132/57, pulse 69, temperature (!) 97.5 F (36.4 C), temperature source Oral, resp. rate 16, SpO2 98 %.    Physical Exam Constitutional:      General: He is not in acute distress.    Appearance: He is underweight.  HENT:     Head: Normocephalic.  Eyes:     Conjunctiva/sclera: Conjunctivae normal.  Cardiovascular:     Rate and Rhythm: Normal rate and regular rhythm.     Pulses: Intact distal pulses.          Carotid pulses are 2+ on the right side and 2+ on the left side.      Femoral pulses are 2+ on the right side and 2+ on the left side.      Popliteal pulses are 2+ on the right side and 2+ on the left side.       Dorsalis pedis pulses are 2+ on the right side and 0 on the left side.  Posterior tibial pulses are 2+ on the right side and 2+ on the left side.     Heart sounds: Normal heart sounds. No murmur heard. No gallop.      Comments: No leg edema, no JVD.  Except for absent left DP, vascular examination is normal. Pulmonary:     Effort: Pulmonary effort is normal. No tachypnea or accessory muscle usage.     Breath sounds: Decreased air movement (Barrel-shaped chest) present. Rhonchi (Diffuse and scattered bilateral) present.  Abdominal:     General: Bowel sounds are normal.     Palpations: Abdomen is soft.  Musculoskeletal:     Cervical back: Normal range of motion.  Skin:    Capillary Refill: Capillary refill takes less than 2 seconds.  Neurological:     General: No  focal deficit present.     Mental Status: He is oriented to person, place, and time.    Laboratory examination:    Recent Labs    10/05/20 1528 10/06/20 0224  NA 134* 133*  K 3.8 3.8  CL 99 98  CO2 22 22  GLUCOSE 273* 385*  BUN 69* 69*  CREATININE 2.38* 2.39*  CALCIUM 9.4 9.1  GFRNONAA 27* 27*   CrCl cannot be calculated (Unknown ideal weight.).  CMP Latest Ref Rng & Units 10/06/2020 10/05/2020 09/21/2018  Glucose 70 - 99 mg/dL 385(H) 273(H) 134(H)  BUN 8 - 23 mg/dL 69(H) 69(H) 40(H)  Creatinine 0.61 - 1.24 mg/dL 2.39(H) 2.38(H) 2.03(H)  Sodium 135 - 145 mmol/L 133(L) 134(L) 137  Potassium 3.5 - 5.1 mmol/L 3.8 3.8 3.7  Chloride 98 - 111 mmol/L 98 99 107  CO2 22 - 32 mmol/L 22 22 20(L)  Calcium 8.9 - 10.3 mg/dL 9.1 9.4 8.9  Total Protein 6.5 - 8.1 g/dL 5.4(L) - -  Total Bilirubin 0.3 - 1.2 mg/dL 1.3(H) - -  Alkaline Phos 38 - 126 U/L 73 - -  AST 15 - 41 U/L 20 - -  ALT 0 - 44 U/L 40 - -   CBC Latest Ref Rng & Units 10/06/2020 10/05/2020 09/21/2018  WBC 4.0 - 10.5 K/uL 2.8(L) 5.3 5.7  Hemoglobin 13.0 - 17.0 g/dL 8.9(L) 9.9(L) 10.4(L)  Hematocrit 39.0 - 52.0 % 27.5(L) 30.5(L) 32.5(L)  Platelets 150 - 400 K/uL 181 220 149(L)   Lipid Panel Recent Labs    10/06/20 0224  CHOL 156  TRIG 86  LDLCALC 78  VLDL 17  HDL 61  CHOLHDL 2.6    HEMOGLOBIN A1C Lab Results  Component Value Date   HGBA1C 8.4 (H) 10/06/2020   MPG 194.38 10/06/2020   TSH Recent Labs    10/06/20 0224  TSH 1.468   BNP (last 3 results) Recent Labs    10/05/20 1528  BNP 288.3*   Medications and allergies   Allergies  Allergen Reactions  . Lisinopril     Angioedema   . Amlodipine Swelling    Swelling of ankles     No current facility-administered medications on file prior to encounter.   Current Outpatient Medications on File Prior to Encounter  Medication Sig Dispense Refill  . albuterol (PROVENTIL HFA;VENTOLIN HFA) 108 (90 Base) MCG/ACT inhaler Inhale 2 puffs into the lungs every 6  (six) hours as needed for wheezing or shortness of breath. 1 Inhaler 0  . apixaban (ELIQUIS) 5 MG TABS tablet Take 5 mg by mouth 2 (two) times daily.    . chlorthalidone (HYGROTON) 25 MG tablet Take 25 mg by mouth daily.    Marland Kitchen  famotidine (PEPCID) 20 MG tablet Take 20 mg by mouth every evening.    . hydrochlorothiazide (HYDRODIURIL) 25 MG tablet Take 25 mg by mouth daily as needed (fluid/swelling).    . Ipratropium-Albuterol (COMBIVENT RESPIMAT) 20-100 MCG/ACT AERS respimat Inhale 1 puff into the lungs every 6 (six) hours.    . Oxycodone HCl 10 MG TABS Take 10 mg by mouth daily as needed.    . ropinirole (REQUIP) 5 MG tablet Take 10 mg by mouth at bedtime.    . traZODone (DESYREL) 50 MG tablet Take 50 mg by mouth at bedtime.    . vitamin E 400 UNIT capsule Take 400 Units by mouth daily.       Scheduled Meds: . apixaban  5 mg Oral BID  . famotidine  20 mg Oral QPM  . furosemide  40 mg Intravenous BID  . insulin aspart  0-9 Units Subcutaneous TID WC  . Ipratropium-Albuterol  1 puff Inhalation Q6H  . ropinirole  10 mg Oral QHS  . traZODone  50 mg Oral QHS   Continuous Infusions: PRN Meds:.acetaminophen **OR** acetaminophen, albuterol, oxyCODONE, polyethylene glycol   I/O last 3 completed shifts: In: -  Out: 1825 [Urine:1825] No intake/output data recorded.   Radiology:   DG Chest 2 View  Result Date: 10/05/2020 CLINICAL DATA:  Short of breath and congestion for 1 week, tobacco abuse EXAM: CHEST - 2 VIEW COMPARISON:  09/21/2018 FINDINGS: Frontal and lateral views of the chest demonstrates stable dual lead pacemaker. Cardiac silhouette is unremarkable. Diffuse interstitial scarring is unchanged. No acute airspace disease, effusion, or pneumothorax. No acute bony abnormalities. IMPRESSION: 1. Chronic scarring, no acute intrathoracic process. Electronically Signed   By: Randa Ngo M.D.   On: 10/05/2020 15:56    Cardiac Studies:   None  EKG:  Underlying atrial fibrillation with  ventricularly paced rhythm.  No further analysis.  Assessment   1.  Acute on chronic diastolic heart failure 2.  Permanent atrial fibrillation, presently on chronic anticoagulation with Eliquis.  Also suspect sick sinus syndrome, SP pacemaker implantation, details not available. 3.  Stage III lung cancer SP radiation therapy and chemotherapy presently on immunotherapy. 4.  Peripheral neuropathy, pseudoclaudication, vascular examination is normal except for absent left DP. 5.  Diabetes mellitus type 2 uncontrolled with hyperglycemia and stage IV chronic kidney disease.  CHA2DS2-VASc Score is 4.  Yearly risk of stroke: 4.8% (A, HTN, CHF).  Score of 1=0.6; 2=2.2; 3=3.2; 4=4.8; 5=7.2; 6=9.8; 7=>9.8) -(CHF; HTN; vasc disease DM,  Male = 1; Age <65 =0; 65-74 = 1,  >75 =2; stroke/embolism= 2).   Recommendations:  Liron Eissler Woods  is a 80 y.o. with permanent atrial fibrillation, chronic diastolic heart failure, hypertension, stage III lung cancer SP radiation therapy and chemotherapy, presently undergoing immunotherapy, who has been having chronic lower extremity weakness, cramping in his legs, cramping in his hands, chronic dyspnea on exertion, presented to the emergency room with worsening dyspnea, leg edema.    Patient symptoms are essentially resolved with regard to leg edema and also dyspnea has improved and there is no further PND and orthopnea is also improved which is chronic related to both lung disease and COPD along with heart failure.  Would recommend changing chlorthalidone that he was on at home to furosemide 40 mg daily, hold off on any ACE inhibitor's or ARB in view of chronic kidney disease, evaluate him for LVEF, suspect he probably has normal LVEF.  We will start him on BiDil 1 p.o. twice daily  and try to avoid hypotension especially in view of chronic kidney disease.  I will consider Wilder Glade or Jardiance as long as renal function is closely followed.    Needs home PT. I have set up  appointment to see me and he is now willing to establish with local physicians including cardiology.  No orders done by me.     Adrian Prows, MD, [facility] 10/06/2020, 9:56 AM Office: [phone]

## 2020-10-06 NOTE — Progress Notes (Signed)
  Echocardiogram 2D Echocardiogram has been performed.  Johnny Woods 10/06/2020, 2:23 PM

## 2020-10-06 NOTE — Progress Notes (Signed)
Inpatient Diabetes Program Recommendations  AACE/ADA: New Consensus Statement on Inpatient Glycemic Control (2015)  Target Ranges:  Prepandial:   less than 140 mg/dL      Peak postprandial:   less than 180 mg/dL (1-2 hours)      Critically ill patients:  140 - 180 mg/dL   Lab Results  Component Value Date   GLUCAP 289 (H) 10/06/2020   HGBA1C 8.4 (H) 10/06/2020    Review of Glycemic Control  Diabetes history: None Outpatient Diabetes medications: Glucometer at home Current orders for Inpatient glycemic control:  Novolog 0-9 units tid  Inpatient Diabetes Program Recommendations:    Spoke with pt at bedside regarding elevated glucose levels and A1c level of 8.4%. Pt has a glucometer at home. Pt reported he has been on steroids for 3-4 weeks. Discussed glucose control and glucose monitoring. Told pt he may be on oral medications at time of d/c but monitor glucose trends to see when/if he can come off of the medication. Discussed side effects of metformin to pt. Discussed diet. Pt does not have many modifications to make with food intake.  Attached information on blood glucose monitoring, hypoglycemia, and diabetes to AVS.  Thanks,  Tama Headings RN, MSN, BC-ADM Inpatient Diabetes Coordinator Team Pager [phone] (8a-5p)

## 2020-10-06 NOTE — ED Notes (Signed)
Pt states he still has not seen a provider to discuss d/c. Messaged ordering provider to inform them of the patient's request to speak to a provider.

## 2020-10-06 NOTE — Hospital Course (Addendum)
F/u recs: Recommended to start on farxiga, A1c 8.4, recommend repeat in 3 months. Monitor renal function during treatment.  Consider repeat BMP, Cr 2.39. Monitor renal function. Ensure patient follows up with cardiology outpatient. Discharged with outpatient PT, encourage gradual physical activity to improve strength.

## 2020-10-06 NOTE — Progress Notes (Signed)
SATURATION QUALIFICATIONS: (This note is used to comply with regulatory documentation for home oxygen)  Patient Saturations on Room Air at Rest = 96%  Patient Saturations on Room Air while Ambulating = 96%   Please briefly explain why patient needs home oxygen: Pt did not require supplemental oxygen to maintain adequate oxygen sats.   Reuel Derby, PT, DPT  Acute Rehabilitation Services  Pager: [phone] Office: [phone]

## 2020-10-06 NOTE — ED Notes (Signed)
Provider spoke with pt over phone. Pt discharged at this time.

## 2020-10-06 NOTE — ED Notes (Signed)
PT at bedside.

## 2020-10-06 NOTE — ED Notes (Signed)
Order for d/c present. Pt states he has not seen a provider about being discharged yet.

## 2020-10-06 NOTE — ED Notes (Signed)
Tele Breakfast order placed

## 2020-10-06 NOTE — Evaluation (Signed)
Physical Therapy Evaluation Patient Details Name: Johnny Woods MRN: [id number] DOB: 02/24/1941 Today's Date: 10/06/2020   History of Present Illness  Pt is a 80 y/o male admitted secondary to SOB. Thought to be from CHF exacerbation. PMH includes a fib, CHF, HTN, and lung cancer.  Clinical Impression  Pt admitted secondary to problem above with deficits below. Pt reporting cramping pain in BLE throughout. Pt requiring supervision for mobility tasks. VSS and oxygen sats at >95% on RA throughout. Educated about use of RW when having increased pain in BLE. Recommending outpatient PT follow up at d/c. Will continue to follow acutely.     Follow Up Recommendations Outpatient PT    Equipment Recommendations  Rolling walker with 5" wheels    Recommendations for Other Services       Precautions / Restrictions Precautions Precautions: Fall Restrictions Weight Bearing Restrictions: No      Mobility  Bed Mobility Overal bed mobility: Needs Assistance Bed Mobility: Supine to Sit;Sit to Supine     Supine to sit: Min assist Sit to supine: Modified independent (Device/Increase time)   General bed mobility comments: Min A for trunk elevation to come to sitting.    Transfers Overall transfer level: Needs assistance Equipment used: None Transfers: Sit to/from Stand Sit to Stand: Supervision         General transfer comment: supervision for safety.  Ambulation/Gait Ambulation/Gait assistance: Supervision Gait Distance (Feet): 20 Feet Assistive device: None Gait Pattern/deviations: Step-to pattern;Step-through pattern;Decreased stride length;Antalgic Gait velocity: Decreased   General Gait Details: Antalgic gait secondary to pt reports of pain. When distracted, does not seem to be as affected by pain as much when ambulating. No LOB noted. Educated about use of RW to help with pain management.  Stairs            Wheelchair Mobility    Modified Rankin (Stroke Patients  Only)       Balance Overall balance assessment: Needs assistance Sitting-balance support: No upper extremity supported;Feet supported Sitting balance-Leahy Scale: Good     Standing balance support: No upper extremity supported;During functional activity Standing balance-Leahy Scale: Fair                               Pertinent Vitals/Pain Pain Assessment: 0-10 Pain Score: 9  Pain Location: BLE and L hand Pain Descriptors / Indicators: Cramping;Discomfort Pain Intervention(s): Limited activity within patient's tolerance;Monitored during session;Repositioned    Home Living Family/patient expects to be discharged to:: Private residence Living Arrangements: Spouse/significant other;Children Available Help at Discharge: Family;Available 24 hours/day Type of Home: House Home Access: Level entry     Home Layout: One level Home Equipment: Cane - single point      Prior Function Level of Independence: Independent with assistive device(s)         Comments: Used cane for ambulation     Hand Dominance        Extremity/Trunk Assessment   Upper Extremity Assessment Upper Extremity Assessment: Defer to OT evaluation    Lower Extremity Assessment Lower Extremity Assessment: Generalized weakness (increased pain in BLE)    Cervical / Trunk Assessment Cervical / Trunk Assessment: Normal  Communication   Communication: No difficulties  Cognition Arousal/Alertness: Awake/alert Behavior During Therapy: WFL for tasks assessed/performed Overall Cognitive Status: Within Functional Limits for tasks assessed  General Comments General comments (skin integrity, edema, etc.): VSS throughout    Exercises     Assessment/Plan    PT Assessment Patient needs continued PT services  PT Problem List Decreased strength;Decreased activity tolerance;Decreased balance;Decreased mobility;Pain       PT Treatment  Interventions DME instruction;Gait training;Therapeutic exercise;Functional mobility training;Balance training;Therapeutic activities;Stair training;Patient/family education    PT Goals (Current goals can be found in the Care Plan section)  Acute Rehab PT Goals Patient Stated Goal: to decrease pain PT Goal Formulation: With patient Time For Goal Achievement: 10/20/20 Potential to Achieve Goals: Fair    Frequency Min 3X/week   Barriers to discharge        Co-evaluation               AM-PAC PT "6 Clicks" Mobility  Outcome Measure Help needed turning from your back to your side while in a flat bed without using bedrails?: None Help needed moving from lying on your back to sitting on the side of a flat bed without using bedrails?: A Little Help needed moving to and from a bed to a chair (including a wheelchair)?: A Little Help needed standing up from a chair using your arms (e.g., wheelchair or bedside chair)?: A Little Help needed to walk in hospital room?: A Little Help needed climbing 3-5 steps with a railing? : A Little 6 Click Score: 19    End of Session   Activity Tolerance: Patient limited by pain Patient left: in bed;with call bell/phone within reach Nurse Communication: Mobility status PT Visit Diagnosis: Pain;Difficulty in walking, not elsewhere classified (R26.2) Pain - Right/Left:  (bilateral) Pain - part of body: Leg    Time: 8469-6295 PT Time Calculation (min) (ACUTE ONLY): 16 min   Charges:   PT Evaluation $PT Eval Low Complexity: 1 Low          Lou Miner, DPT  Acute Rehabilitation Services  Pager: [phone] Office: [phone]   Rudean Hitt 10/06/2020, 10:41 AM

## 2020-10-06 NOTE — Progress Notes (Signed)
Occupational Therapy Evaluation  Pt states he "feels much better". Overall S with mobility and ADL tasks due to "not getting up". Educated pt on energy conservation and strategies to reduce risk of falls. Educated pt on CHF and lifestyle changes, including weighing himself daily, decreasing salt intake, quitting smoking and discussing fluid intake and managing his BP with his MD. Pt verbalized understanding. VSS on RA with 1/4 DOE noted. No further acute OT needs. OT signing off.     10/06/20 1500  OT Visit Information  Last OT Received On 10/06/20  Assistance Needed +1  History of Present Illness Pt is a 80 y/o male admitted secondary to SOB. Thought to be from CHF exacerbation. PMH includes a fib, CHF, HTN, and lung cancer.  Precautions  Precautions Fall  Home Living  Family/patient expects to be discharged to: Private residence  Living Arrangements Spouse/significant other;Children  Available Help at Discharge Family;Available 24 hours/day  Type of Home House  Home Access Level entry  Home Layout One level  Bathroom Shower/Tub Tub/shower unit;Walk-in Cytogeneticist Yes  How Accessible Accessible via walker  [place] - single point  Prior Function  Level of Independence Independent with assistive device(s)  Comments Used cane for ambulation; ADL tasks and mobility have become more difficulty over last several weeks  Communication  Communication No difficulties  Pain Assessment  Pain Assessment Faces  Faces Pain Scale 2  Pain Location generalized stiffness  Pain Descriptors / Indicators Discomfort  Pain Intervention(s) Limited activity within patient's tolerance  Cognition  Arousal/Alertness Awake/alert  Behavior During Therapy WFL for tasks assessed/performed  Overall Cognitive Status Within Functional Limits for tasks assessed  Upper Extremity Assessment  Upper Extremity Assessment Overall WFL for tasks assessed  Lower  Extremity Assessment  Lower Extremity Assessment Defer to PT evaluation  Cervical / Trunk Assessment  Cervical / Trunk Assessment Normal  ADL  Overall ADL's  Needs assistance/impaired  Functional mobility during ADLs Supervision/safety  General ADL Comments Pt overall S for ADL tasks due to "not being up". Educated pt on energy conservation adn strategies to reduce risk of falls. Recommend shower chair however pt declines. Educated pt on managing CHF, including need to weigh himself daily and discuss fluid restrictions with his MD.  Bed Mobility  Overal bed mobility Needs Assistance;Modified Independent  General bed mobility comments sitting EOB on entry  Transfers  Overall transfer level Needs assistance  Equipment used None  Transfers Sit to/from Stand  Sit to Stand Supervision  General transfer comment supervision for safety.  Balance  Overall balance assessment Needs assistance  Sitting-balance support No upper extremity supported;Feet supported  Sitting balance-Leahy Scale Good  Standing balance support No upper extremity supported;During functional activity  Standing balance-Leahy Scale Fair  General Comments  General comments (skin integrity, edema, etc.) VSS  Exercises  Exercises Other exercises  Other Exercises  Other Exercises pursed lip breathing  OT - End of Session  Equipment Utilized During Treatment Gait belt  Activity Tolerance Patient tolerated treatment well  Patient left in bed;with call bell/phone within reach  Nurse Communication Mobility status;Other (comment) (DC needs)  OT Assessment  OT Recommendation/Assessment Patient does not need any further OT services  OT Visit Diagnosis Muscle weakness (generalized) (M62.81);Pain  Pain - part of body  ("stiffness")  OT Problem List Decreased activity tolerance;Pain;Decreased knowledge of use of DME or AE;Cardiopulmonary status limiting activity  AM-PAC OT "6 Clicks" Daily Activity Outcome Measure (Version 2)   Help  from another person eating meals? 4  Help from another person taking care of personal grooming? 3  Help from another person toileting, which includes using toliet, bedpan, or urinal? 3  Help from another person bathing (including washing, rinsing, drying)? 3  Help from another person to put on and taking off regular upper body clothing? 3  Help from another person to put on and taking off regular lower body clothing? 3  6 Click Score 19  OT Recommendation  Follow Up Recommendations No OT follow up;Supervision - Intermittent  OT Equipment None recommended by OT (recommend shower seat however pt declined)  Acute Rehab OT Goals  Patient Stated Goal to go home  OT Goal Formulation All assessment and education complete, DC therapy  OT Time Calculation  OT Start Time (ACUTE ONLY) 1307  OT Stop Time (ACUTE ONLY) 1330  OT Time Calculation (min) 23 min  OT General Charges  $OT Visit 1 Visit  OT Evaluation  $OT Eval Low Complexity 1 Low  OT Treatments  $Self Care/Home Management  8-22 mins  Written Expression  Dominant Hand Right  Maurie Boettcher, OT/L   Acute OT Clinical Specialist [place] Pager [phone] Office [phone]

## 2020-10-06 NOTE — Discharge Instructions (Signed)
You were hospitalized at [facility] due to shortness of breath.  We expect this is from fluid overload from congestive heart failure exacerbation which improved after giving you your diuretic medication  We are so glad you are feeling better.    Please also be sure to follow-up with our clinic with Dr. Larae Grooms on 10/19/2020 at 11:25 am.  Please also follow up with cardiology outpatient, your next appointment is 10/17/2020 at 12pm. Thank you for allowing [place] to be a part of your medical care.  Take care, [facility] family medicine team

## 2020-10-14 ENCOUNTER — Telehealth: Payer: Self-pay

## 2020-10-14 NOTE — Telephone Encounter (Signed)
NOTES ON FILE FROM DEPARTMENT [phone], SENT REFERRAL TO SCHEDULING

## 2020-10-17 ENCOUNTER — Ambulatory Visit: Payer: Self-pay | Admitting: Cardiology

## 2020-10-19 ENCOUNTER — Ambulatory Visit (INDEPENDENT_AMBULATORY_CARE_PROVIDER_SITE_OTHER): Payer: Medicare HMO | Admitting: Family Medicine

## 2020-10-19 ENCOUNTER — Other Ambulatory Visit: Payer: Self-pay

## 2020-10-19 ENCOUNTER — Ambulatory Visit: Payer: Medicare HMO | Attending: Family Medicine

## 2020-10-19 VITALS — BP 130/60 | HR 63 | Wt 136.2 lb

## 2020-10-19 DIAGNOSIS — R2689 Other abnormalities of gait and mobility: Secondary | ICD-10-CM | POA: Insufficient documentation

## 2020-10-19 DIAGNOSIS — R2681 Unsteadiness on feet: Secondary | ICD-10-CM | POA: Diagnosis not present

## 2020-10-19 DIAGNOSIS — F339 Major depressive disorder, recurrent, unspecified: Secondary | ICD-10-CM | POA: Diagnosis not present

## 2020-10-19 DIAGNOSIS — M6281 Muscle weakness (generalized): Secondary | ICD-10-CM | POA: Diagnosis not present

## 2020-10-19 DIAGNOSIS — I5023 Acute on chronic systolic (congestive) heart failure: Secondary | ICD-10-CM | POA: Diagnosis not present

## 2020-10-19 DIAGNOSIS — E119 Type 2 diabetes mellitus without complications: Secondary | ICD-10-CM

## 2020-10-19 DIAGNOSIS — R609 Edema, unspecified: Secondary | ICD-10-CM

## 2020-10-19 NOTE — Assessment & Plan Note (Signed)
-  chronic and ongoing -Reviewed PHQ-9 score of 10, with negative question 9. Patient declines both medication and therapy at this time. -trazodone continued for sleep -f/u in 2-3 months

## 2020-10-19 NOTE — Progress Notes (Signed)
    SUBJECTIVE:   CHIEF COMPLAINT / HPI:   Patient presents for a hospital follow up. Most recently hospitalized for acute on chronic chronic heart failure exacerbation. Reports overall significant improvements in symptoms including dyspnea. Denies chest pain, orthopnea, nausea, vomiting, fever and chills. Endorses fatigue as he is still recovering from his recent hospitalization. Compliant on medications instructed to take at discharge including lasix 40 mg daily, no complications noted. Reports that he is still smoking about 1/2 pack a day on average, interested in quitting.   Type 2 DM A1c 8.4 during recent hospitalization with blood glucose levels in the 200s on admission. Currently not on a diabetic regimen. Notes that has been checking his glucose levels and they have been around the 120s most recently. Denies known hypoglycemic episodes. Denies dizziness or weakness. Endorses eating a balanced diet daily where he incorporates plenty of fruits and vegetables.  PERTINENT  PMH / PSH:   Depression Endorses feeling more "moody" lately. Reports that he has been having trouble keeping up with his 25 year old daughter and finds himself getting agitated more often recently. States that he has intermittently had episodes of feeling down and depressed for years. He has previously been on medication but states that it did not work. Patient has also been to therapy years ago, which he states also was not helpful. Patient is currently not interested in both pharmacological intervention and therapy. Endorsing having trouble sleeping which he has had for years, takes trazodone every night. Reports that he needs the trazodone to help him sleep better at night.      OBJECTIVE:   BP 130/60   Pulse 63   Wt 136 lb 4 oz (61.8 kg)   SpO2 99%   BMI 20.12 kg/m   General: Patient well-appearing, in no acute distress. CV: RRR, no murmurs or gallops auscultated Resp: CTAB, no rales or rhonchi noted Abdomen:  soft, nontender, presence of bowel sounds Ext: 1+ pitting LE edema noted bilaterally, radial pulses strong and equal bilaterally Neuro: normal gait Psych: mood appropriate, denies SI   ASSESSMENT/PLAN:   CHF (congestive heart failure) ([place]) -continue on current medication regimen, including lasix  -elevate legs as appropriate to help alleviate leg edema -ensure cardiology outpatient follow up on 3/18 -working with outpatient PT -reassurance provided   Depression, recurrent ([place]) -chronic and ongoing -Reviewed PHQ-9 score of 10, with negative question 9. Patient declines both medication and therapy at this time. -trazodone continued for sleep -f/u in 2-3 months     Diabetes mellitus without complication ([place]) -pending BMP given elevated creatinine on last admission -no changes made at this time -diet and exercise counseling -f/u in 2-3 months for repeat A1c -consider initiation of diabetic regimen at next visit and once lab work returns      Donney Dice, [place]

## 2020-10-19 NOTE — Patient Instructions (Signed)
It was great seeing you today!  Today we discussed your recent hospital visit. I am so glad you are feeling better. I will let you know of any abnormal results from your blood work, based on that we will see if we should make any changes.   Please see the cardiologist on 3/18 for your preexisting scheduled appointment. Also make sure to follow up with the oncologist on 3/24. We discussed the importance of regular follow up for these appointments.   Try to elevate your legs when you are home, that will help with the leg swelling.  Please follow up in 2 months, if anything arises between now and then, please don't hesitate to contact our office.   Thank you for allowing [place] to be a part of your medical care!  Thank you, Dr. Larae Grooms

## 2020-10-19 NOTE — Patient Instructions (Signed)
Access Code: [facility] URL: [email or web address] Date: 10/19/2020 Prepared by: Sherlynn Stalls  Program Notes Make sure you count out loud with your exercises.  Take your time and take rest breaks as needed.  Elevate legs as  your doctor recommended for the swelling.    Exercises Seated Ankle Pumps - 1 x daily - 7 x weekly - 2 sets - 5 reps Seated Long Arc Quad - 1 x daily - 7 x weekly - 2 sets - 5 reps Sit to Stand with Counter Support - 1 x daily - 7 x weekly - 2 sets - 5 reps

## 2020-10-19 NOTE — Assessment & Plan Note (Signed)
-  continue on current medication regimen, including lasix  -elevate legs as appropriate to help alleviate leg edema -ensure cardiology outpatient follow up on 3/18 -working with outpatient PT -reassurance provided

## 2020-10-19 NOTE — Therapy (Signed)
[place]. [place], [place], [address] Phone: [phone]   Fax:  [phone]  Physical Therapy Evaluation  Patient Details  Name: Johnny Woods MRN: [id number] Date of Birth: 17-Apr-1941 Referring Provider (PT): Chambliss   Encounter Date: 10/19/2020   PT End of Session - 10/19/20 1804    Visit Number 1    Number of Visits 17    Date for PT Re-Evaluation 12/14/20    Authorization Type [employer]    PT Start Time 7681    PT Stop Time 1572    PT Time Calculation (min) 45 min    Activity Tolerance Patient limited by pain;Patient tolerated treatment well    Behavior During Therapy [facility] for tasks assessed/performed           Past Medical History:  Diagnosis Date  . Atrial fibrillation ([place])   . CHF (congestive heart failure) ([place])   . Hypertension     Past Surgical History:  Procedure Laterality Date  . ABLATION    . BACK SURGERY    . CHOLECYSTECTOMY    . PACEMAKER INSERTION  1997  . ruptured testicle      There were no vitals filed for this visit.    Subjective Assessment - 10/19/20 1450    Subjective Was on steroids prior to getting SOB, noted at Oncologist appointment and pt was sent to ED. CHF exacerbation 10/05/2020 presenting to ED, transfered to [facility] from [place]. Says oxyen levels stayed WNL but with really poor endurance and SOB. Has had other exacerbations in the past. Pt is a Designer, industrial/product.    Pertinent History AKI, Anemia, HTN, Sick sinus syndrome with a Pacemaker, Afib s/p ablation, 2 yrs Stage 3 Non-small cell lung cancer under immunotherapy (treated at [place]), GERD, COPD, Back injury with operation in 1960s. Chemo induced peripheral neuropathy    Limitations Walking    Patient Stated Goals to improve strength and weight,  improve endurance, decrease pain    Currently in Pain? Yes   Also gets lower back pain that can get to 10/10 and hard to walk not too bad today.   Pain Score 8      Pain Location Leg    Pain Orientation Right;Left    Pain Descriptors / Indicators Tightness;Burning;Cramping   pins and needles at the hands and feet as well. tskes medicine for muscle cramps   Pain Type Acute pain;Chronic pain;Neuropathic pain    Pain Radiating Towards up entire legs    Pain Onset More than a month ago    Pain Frequency Constant    Aggravating Factors  sitting a long time    Pain Relieving Factors Oxycodone/prescription pain medication. Elevation with minimal decrease in pain. getting up and walking around              [facility] PT Assessment - 10/19/20 1501      Assessment   Medical Diagnosis Acute on Chronic CHF    Referring Provider (PT) Chambliss    Hand Dominance Right      Precautions   Precautions ICD/Pacemaker;Fall   pacemaker     Balance Screen   Has the patient fallen in the past 6 months No    Has the patient had a decrease in activity level because of a fear of falling?  Yes    Is the patient reluctant to leave their home because of a fear of falling?  No      Home Environment  Additional Comments House with wife and daughter. 1 level. 2 steps to get into front door. Avoids using steps "not going to be able to" - uses other entrance      Prior Function   Level of Independence Independent with basic ADLs    Vocation Retired    [employer] Previous [employer], worked in [employer], garden work, yard work - unable to do now limited to 5 minutes with activities.      Cognition   Overall Cognitive Status Within Functional Limits for tasks assessed      Observation/Other Assessments   Observations B LE edema distal      Posture/Postural Control   Posture/Postural Control Postural limitations    Postural Limitations Rounded Shoulders;Forward head      ROM / Strength   AROM / PROM / Strength Strength      Strength   Overall Strength Comments Grossly 3+/5 BLE, BLE with alot of pain      Transfers   Five time sit to  stand comments  25 seconds, with c/o 7-8  shortness of breath, effortfull      Ambulation/Gait   Assistive device --   Indep but uses cane for long distances and community distances   Gait Pattern Wide base of support;Abducted - left;Abducted- right;Trendelenburg    Gait velocity 0.465 m/s.   <0.7 risk of falls   Stairs Yes    Stairs Assistance 6: Modified independent (Device/Increase time);5: Supervision    Stairs Assistance Details (indicate cue type and reason) B HR - decreased pacing, limited by pain and weakness.      Balance   Balance Assessed Yes    Balance comment MCSTIB: EO/NBOS/Firm - 20 seconds with instability, EC/NBOS/Firm - 10 seconds with mod sway and increased fear of falling. EO/Foam- 5 seconds mod sway. EC/foam - unable      Standardized Balance Assessment   Standardized Balance Assessment Timed Up and Go Test      Timed Up and Go Test   Normal TUG (seconds) 13    TUG Comments RPE/Borg dyspnea 5-6/10                      Objective measurements completed on examination: See above findings.               PT Education - 10/19/20 1812    Education Details Initial PT POC and HEP    Person(s) Educated Patient    Methods Explanation;Demonstration;Handout    Comprehension Verbalized understanding;Returned demonstration            PT Short Term Goals - 10/19/20 1757      PT SHORT TERM GOAL #1   Title Independent with initial HEP    Time 2    Period Weeks    Status New    Target Date 11/02/20             PT Long Term Goals - 10/19/20 1757      PT LONG TERM GOAL #1   Title Independent with advanced HEP    Time 8    Period Weeks    Status New    Target Date 12/14/20      PT LONG TERM GOAL #2   Title TUG </= 12 seconds with Borg dyspnea scale report of </= 3/10    Baseline 13 seconds, dyspnea 5-6/10    Time 8    Period Weeks    Status New  Target Date 12/14/20      PT LONG TERM GOAL #3   Title 5TSTS to be completed in  less than 13 seconds with no greater than 3-4/10 dyspnea reported to demo improved functional strength and endurance    Time 8    Period Weeks    Status New    Target Date 12/14/20      PT LONG TERM GOAL #4   Title Gait speed >/= 0.7 m/s to demo improvedfunctional ambulation with dyspnea </= 3/10.    Baseline 0.465 m/s    Time 8    Period Weeks    Status New    Target Date 12/14/20                  Plan - 10/19/20 1520    Clinical Impression Statement Pt is a 80 yo male who was referred to physical therapy evaluation for acute on chronic CHF with most recent exacerbation 10/05/20. PMH significant for AKI, HTN, Sick sinus syndrome with a Pacemaker, Afib s/p ablation, Stage 3 Non-small cell lung cancer under immunotherapy (treated at [place]), GERD, COPD, history of back injury and pain, Chemo induced peripheral neuropathy. Pt currently presents with shortness of breath on exertion, decreased cardiovasuclar and muscular endurance, muscle weakness, decreased balance, BLE pain and edema, and abnormal gait. Vital signs taken repeatedly thorughout session and stable throughout. He had most diffiuclty with 5 times sit to stand test scoring 25 seconds with significant diffiuclty, indicating balance dysfunction and falls risk at this time.  As a result of these impairments patient has had diffiuclty with walking and general functional mobility. He will benefit from skilled physical therapy to address the aforementioned issues and work toward decreasing pain, improving endurance, and improving functional mobility, strength and balance.    Personal Factors and Comorbidities Age;Past/Current Experience;Time since onset of injury/illness/exacerbation;Comorbidity 3+;Fitness    Stability/Clinical Decision Making Evolving/Moderate complexity    Clinical Decision Making Moderate    Rehab Potential Good    PT Frequency 2x / week    PT Duration 8 weeks   6-8   PT Treatment/Interventions Functional mobility  training;Therapeutic activities;Therapeutic exercise;Balance training;Neuromuscular re-education;Gait training;Stair training;Patient/family education;Manual techniques;Energy conservation;Cryotherapy;Moist Heat    PT Next Visit Plan Reassess HEP. Monitor VS throughout session and assess RPE as needed. Begin with low reps and breaks in between sets for recovery, continue educaiton on pursed lip breathing. Gradually progress strength balance and endurance training as tolerated.    PT Home Exercise Plan see pt instructions    Consulted and Agree with Plan of Care Patient           Patient will benefit from skilled therapeutic intervention in order to improve the following deficits and impairments:  Abnormal gait,Difficulty walking,Increased muscle spasms,Decreased endurance,Cardiopulmonary status limiting activity,Decreased activity tolerance,Pain,Improper body mechanics,Decreased balance,Decreased mobility,Decreased strength,Increased edema  Visit Diagnosis: Muscle weakness (generalized) - Plan: PT plan of care cert/re-cert  Other abnormalities of gait and mobility - Plan: PT plan of care cert/re-cert  Unsteadiness on feet - Plan: PT plan of care cert/re-cert  Edema, unspecified type - Plan: PT plan of care cert/re-cert     Problem List Patient Active Problem List   Diagnosis Date Noted  . CHF exacerbation ([place]) 10/05/2020  . CHF (congestive heart failure) ([place]) 10/05/2020    Hall Busing , PT, DPT 10/19/2020, 6:17 PM  [place]. [place], [place], [address] Phone: [phone]   Fax:  [phone]  Name: Johnny Baltimore  J Woods MRN: [id number] Date of Birth: 1941-01-28

## 2020-10-19 NOTE — Assessment & Plan Note (Signed)
-  pending BMP given elevated creatinine on last admission -no changes made at this time -diet and exercise counseling -f/u in 2-3 months for repeat A1c -consider initiation of diabetic regimen at next visit and once lab work returns

## 2020-10-20 LAB — BASIC METABOLIC PANEL
BUN/Creatinine Ratio: 17 (ref 10–24)
BUN: 44 mg/dL — ABNORMAL HIGH (ref 8–27)
CO2: 26 mmol/L (ref 20–29)
Calcium: 9.6 mg/dL (ref 8.6–10.2)
Chloride: 96 mmol/L (ref 96–106)
Creatinine, Ser: 2.53 mg/dL — ABNORMAL HIGH (ref 0.76–1.27)
Glucose: 203 mg/dL — ABNORMAL HIGH (ref 65–99)
Potassium: 3.6 mmol/L (ref 3.5–5.2)
Sodium: 136 mmol/L (ref 134–144)
eGFR: 25 mL/min/{1.73_m2} — ABNORMAL LOW (ref >59)

## 2020-10-20 LAB — HEPATITIS C ANTIBODY: Hep C Virus Ab: <0.1 s/co ratio (ref 0.0–0.9)

## 2020-10-28 ENCOUNTER — Encounter: Payer: Self-pay | Admitting: Cardiology

## 2020-10-28 ENCOUNTER — Other Ambulatory Visit: Payer: Self-pay

## 2020-10-28 ENCOUNTER — Ambulatory Visit: Payer: No Typology Code available for payment source | Admitting: Cardiology

## 2020-10-28 VITALS — BP 119/64 | HR 76 | Temp 99.0°F | Resp 16 | Ht 69.0 in | Wt 135.8 lb

## 2020-10-28 DIAGNOSIS — N184 Chronic kidney disease, stage 4 (severe): Secondary | ICD-10-CM | POA: Diagnosis not present

## 2020-10-28 DIAGNOSIS — I10 Essential (primary) hypertension: Secondary | ICD-10-CM | POA: Diagnosis not present

## 2020-10-28 DIAGNOSIS — I2781 Cor pulmonale (chronic): Secondary | ICD-10-CM

## 2020-10-28 DIAGNOSIS — I4821 Permanent atrial fibrillation: Secondary | ICD-10-CM | POA: Insufficient documentation

## 2020-10-28 DIAGNOSIS — Z950 Presence of cardiac pacemaker: Secondary | ICD-10-CM

## 2020-10-28 DIAGNOSIS — I5032 Chronic diastolic (congestive) heart failure: Secondary | ICD-10-CM | POA: Diagnosis not present

## 2020-10-28 NOTE — Progress Notes (Signed)
Primary Physician/Referring:  Donney Dice, DO  Patient ID: Johnny Woods, male    DOB: 1940-12-30, 80 y.o.   MRN: [id number]  Chief Complaint  Patient presents with  . Congestive Heart Failure  . Atrial Fibrillation   HPI:    Johnny Woods  is a 80 y.o. permanent atrial fibrillation, chronic diastolic heart failure, hypertension,  sick sinus syndrome and high degree AV block SP permanent pacemaker implantation, stage III lung cancer SP radiation therapy and chemotherapy, presently undergoing immunotherapy, who has been having chronic lower extremity weakness, cramping in his legs, cramping in his hands, chronic dyspnea on exertion, presented to the emergency room on 10/05/2020 with worsening dyspnea, leg edema.  He was treated for acute decompensated diastolic heart failure discharged home.  He is presently feeling , he still has significant weakness in his legs and is undergoing outpatient physical therapy.  Leg edema has completely resolved since being on furosemide.  Dyspnea is improved.  Blood pressure is also well controlled.  Past Medical History:  Diagnosis Date  . Atrial fibrillation ([place])   . CHF (congestive heart failure) ([place])   . Chronic cor pulmonale ([facility])   . Chronic diastolic heart failure ([place])   . COPD (chronic obstructive pulmonary disease) ([place])   . Emphysema lung ([place])   . Hypertension   . Lung cancer ([place]) 2020   Stage 3  . Permanent atrial fibrillation ([place])   . Primary hypertension    Past Surgical History:  Procedure Laterality Date  . ABLATION    . BACK SURGERY    . CHOLECYSTECTOMY    . PACEMAKER INSERTION  1997  . ruptured testicle     Family History  Problem Relation Age of Onset  . Alzheimer's disease Mother   . Lung disease Father   . Heart failure Father   . Cancer Sister   . Colon cancer Brother   . Cancer Sister   . Kidney disease Sister     Social History   Tobacco Use  . Smoking status: Current Some Day Smoker    Types:  Cigarettes  . Smokeless tobacco: Never Used  Substance Use Topics  . Alcohol use: No   Marital Status: Married  ROS  Review of Systems  Cardiovascular: Positive for dyspnea on exertion and leg swelling. Negative for chest pain.  Musculoskeletal: Positive for arthritis, back pain and muscle weakness.  Gastrointestinal: Negative for melena.  Neurological: Positive for paresthesias.   Objective  Blood pressure 119/64, pulse 76, temperature 99 F (37.2 C), temperature source Temporal, resp. rate 16, height 5\' 9"  (1.753 m), weight 135 lb 12.8 oz (61.6 kg), SpO2 98 %.  Vitals with BMI 10/28/2020 10/19/2020 10/06/2020  Height 5\' 9"  - -  Weight 135 lbs 13 oz 136 lbs 4 oz -  BMI 78.29 56.21 -  Systolic 308 657 846  Diastolic 64 60 44  Pulse 76 63 54     Physical Exam Cardiovascular:     Rate and Rhythm: Normal rate and regular rhythm.     Pulses: Intact distal pulses.          Carotid pulses are 2+ on the right side and 2+ on the left side.      Radial pulses are 2+ on the right side and 2+ on the left side.       Femoral pulses are 2+ on the right side and 2+ on the left side.      Popliteal pulses are 2+ on the right  side and 2+ on the left side.       Dorsalis pedis pulses are 2+ on the right side and 0 on the left side.       Posterior tibial pulses are 2+ on the right side and 2+ on the left side.     Heart sounds: Normal heart sounds. No murmur heard. No gallop.      Comments: No leg edema, no JVD. Pulmonary:     Effort: Pulmonary effort is normal. No respiratory distress.     Breath sounds: Wheezing (diffuse expiratory ronchi a\nd wheezing, mild) present.  Abdominal:     General: Bowel sounds are normal.     Palpations: Abdomen is soft.    Laboratory examination:   Recent Labs    10/05/20 1528 10/06/20 0224 10/19/20 1205  NA 134* 133* 136  K 3.8 3.8 3.6  CL 99 98 96  CO2 22 22 26   GLUCOSE 273* 385* 203*  BUN 69* 69* 44*  CREATININE 2.38* 2.39* 2.53*  CALCIUM 9.4  9.1 9.6  GFRNONAA 27* 27*  --    estimated creatinine clearance is 20.6 mL/min (A) (by C-G formula based on SCr of 2.53 mg/dL (H)).  CMP Latest Ref Rng & Units 10/19/2020 10/06/2020 10/05/2020  Glucose 65 - 99 mg/dL 203(H) 385(H) 273(H)  BUN 8 - 27 mg/dL 44(H) 69(H) 69(H)  Creatinine 0.76 - 1.27 mg/dL 2.53(H) 2.39(H) 2.38(H)  Sodium 134 - 144 mmol/L 136 133(L) 134(L)  Potassium 3.5 - 5.2 mmol/L 3.6 3.8 3.8  Chloride 96 - 106 mmol/L 96 98 99  CO2 20 - 29 mmol/L 26 22 22   Calcium 8.6 - 10.2 mg/dL 9.6 9.1 9.4  Total Protein 6.5 - 8.1 g/dL - 5.4(L) -  Total Bilirubin 0.3 - 1.2 mg/dL - 1.3(H) -  Alkaline Phos 38 - 126 U/L - 73 -  AST 15 - 41 U/L - 20 -  ALT 0 - 44 U/L - 40 -   CBC Latest Ref Rng & Units 10/06/2020 10/05/2020 09/21/2018  WBC 4.0 - 10.5 K/uL 2.8(L) 5.3 5.7  Hemoglobin 13.0 - 17.0 g/dL 8.9(L) 9.9(L) 10.4(L)  Hematocrit 39.0 - 52.0 % 27.5(L) 30.5(L) 32.5(L)  Platelets 150 - 400 K/uL 181 220 149(L)    Lipid Panel Recent Labs    10/06/20 0224  CHOL 156  TRIG 86  LDLCALC 78  VLDL 17  HDL 61  CHOLHDL 2.6   Lipid Panel     Component Value Date/Time   CHOL 156 10/06/2020 0224   TRIG 86 10/06/2020 0224   HDL 61 10/06/2020 0224   CHOLHDL 2.6 10/06/2020 0224   VLDL 17 10/06/2020 0224   LDLCALC 78 10/06/2020 0224     HEMOGLOBIN A1C Lab Results  Component Value Date   HGBA1C 8.4 (H) 10/06/2020   MPG 194.38 10/06/2020   TSH Recent Labs    10/06/20 0224  TSH 1.468   Medications and allergies   Allergies  Allergen Reactions  . Lisinopril Swelling and Other (See Comments)    Angioedema   . Ace Inhibitors Swelling and Other (See Comments)    "Angioedema"  . Amlodipine Swelling and Other (See Comments)    Swelling of ankles and lower limbs     Outpatient Medications Prior to Visit  Medication Sig  . albuterol (PROVENTIL HFA;VENTOLIN HFA) 108 (90 Base) MCG/ACT inhaler Inhale 2 puffs into the lungs every 6 (six) hours as needed for wheezing or shortness of  breath.  Marland Kitchen apixaban (ELIQUIS) 5 MG TABS tablet Take  5 mg by mouth 2 (two) times daily.  . chlorthalidone (HYGROTON) 25 MG tablet Take 25 mg by mouth daily.  . Cholecalciferol (VITAMIN D3) 50 MCG (2000 UT) TABS Take 2,000 microcuries/1.69m2 by mouth daily.  . famotidine (PEPCID) 20 MG tablet Take 20 mg by mouth at bedtime.  . furosemide (LASIX) 40 MG tablet Take 1 tablet (40 mg total) by mouth daily.  . Oxycodone HCl 10 MG TABS Take 5-10 mg by mouth daily as needed (for pain).  Marland Kitchen rOPINIRole (REQUIP) 0.5 MG tablet Take 1 mg by mouth at bedtime.  . Sennosides 8.6 MG CAPS Take 1 capsule by mouth 2 (two) times daily.  . sildenafil (VIAGRA) 100 MG tablet Take 100 mg by mouth daily as needed for erectile dysfunction.  . tamsulosin (FLOMAX) 0.4 MG CAPS capsule Take 0.8 mg by mouth at bedtime.  . Tiotropium Bromide-Olodaterol (STIOLTO RESPIMAT) 2.5-2.5 MCG/ACT AERS Inhale 2 puffs into the lungs in the morning.  . [DISCONTINUED] acetaminophen (TYLENOL) 500 MG tablet Take 1,000 mg by mouth every 8 (eight) hours as needed for mild pain.  . [DISCONTINUED] polyethylene glycol (MIRALAX / GLYCOLAX) 17 g packet Take 17 g by mouth daily as needed for mild constipation. (Patient not taking: Reported on 10/19/2020)  . [DISCONTINUED] senna (SENOKOT) 8.6 MG TABS tablet Take 2 tablets by mouth 2 (two) times daily as needed for mild constipation. (Patient not taking: Reported on 10/19/2020)  . [DISCONTINUED] traZODone (DESYREL) 50 MG tablet Take 25-50 mg by mouth at bedtime as needed for sleep.  . [DISCONTINUED] vitamin E 400 UNIT capsule Take 400 Units by mouth daily.   No facility-administered medications prior to visit.    Radiology:   CT Chest 07/14/2020: 1. The treated left upper lobe nodule is unchanged, with no evidence of local recurrence.  2. Moderate pericardial effusion, interstitial edema, and small left pleural effusion could be related to overhydration or decompensated heart failure.  3. Increased area  of interstitial opacity within the left lung base could be related to pneumonia or aspiration.  4. The left lower lobe nodules that were of concern on 08/03/2019 have decreased in size.  CXR 10/05/2020: Frontal and lateral views of the chest demonstrates stable dual lead pacemaker. Cardiac silhouette is unremarkable. Diffuse interstitial scarring is unchanged. No acute airspace disease, effusion, or pneumothorax. No acute bony abnormalities.   Cardiac Studies:   Echocardiogram 10/06/2020:    1. Left ventricular ejection fraction, by estimation, is 60 to 65%. The left ventricle has normal function. The left ventricle has no regional wall motion abnormalities. Left ventricular diastolic parameters are consistent with Grade III diastolic dysfunction (restrictive).  2. Right ventricular systolic function is normal. The right ventricular size is normal. There is moderately elevated pulmonary artery systolic pressure. The estimated right ventricular systolic pressure is 14.4 mmHg.  3. Left atrial size was severely dilated.  4. A small pericardial effusion is present. The pericardial effusion is circumferential. There is no evidence of cardiac tamponade.  5. The mitral valve is normal in structure. Moderate mitral valve regurgitation. No evidence of mitral stenosis.  6. Tricuspid valve regurgitation is severe.  7. The inferior vena cava is dilated in size with >50% respiratory variability, suggesting right atrial pressure of 8 mmHg.  EKG:     EKG 10/28/2020: Ventricularly paced rhythm.  No further analysis.  10/05/2020: Underlying atrial fibrillation with ventricularly paced rhythm.  No further analysis.  Assessment     ICD-10-CM   1. Permanent atrial fibrillation (HCC)  I48.21 EKG  12-Lead  2. Chronic diastolic (congestive) heart failure (HCC)  I50.32   3. Primary hypertension  I10   4. Chronic cor pulmonale ([facility])  I27.81   5. Pacemaker: Medtronic Versa Dr single chamber pacemaker  (Gen  Change 01/30/96)  Z95.0     CHA2DS2-VASc Score is 4.  Yearly risk of stroke: 4.8% (A, HTN, CHF).  Score of 1=0.6; 2=2.2; 3=3.2; 4=4.8; 5=7.2; 6=9.8; 7=>9.8) -(CHF; HTN; vasc disease DM,  Male = 1; Age <65 =0; 65-74 = 1,  >75 =2; stroke/embolism= 2).   Medications Discontinued During This Encounter  Medication Reason  . acetaminophen (TYLENOL) 500 MG tablet Patient Preference  . senna (SENOKOT) 8.6 MG TABS tablet Patient Preference  . polyethylene glycol (MIRALAX / GLYCOLAX) 17 g packet Error  . traZODone (DESYREL) 50 MG tablet Error  . vitamin E 400 UNIT capsule Error    No orders of the defined types were placed in this encounter.  Orders Placed This Encounter  Procedures  . EKG 12-Lead   Recommendations:   Johnny Woods is a 80 y.o. permanent atrial fibrillation, chronic diastolic heart failure, hypertension,  sick sinus syndrome and high degree AV block SP permanent pacemaker implantation, stage III lung cancer SP radiation therapy and chemotherapy, presently undergoing immunotherapy, who has been having chronic lower extremity weakness, cramping in his legs, cramping in his hands, chronic dyspnea on exertion, presented to the emergency room on 10/05/2020 with worsening dyspnea, leg edema.  He was treated for acute decompensated diastolic heart failure discharged home.  Patient presents to the office, he is now well compensated with regard to heart failure.  Suspect his right-sided heart failure is related to cor pulmonale from COPD and lung scarring.  With regard to atrial fibrillation, is presently on anticoagulation.  [facility] to continue to [facility] there but wants to see me every 6 months for emergencies only.  His pacemaker card information obtained.  [facility].  Otherwise stable from cardiac standpoint, advised him that if leg edema has resolved, he  could start taking Lasix every other day or even every third day.  He does have stage IV chronic kidney disease and may need chronic furosemide on a daily basis.  He is well compensated from heart standpoint.  Office visit in 6 months.    Adrian Prows, MD, [facility] 10/28/2020, 11:39 AM Office: [phone]

## 2020-11-01 ENCOUNTER — Other Ambulatory Visit: Payer: Self-pay

## 2020-11-01 ENCOUNTER — Encounter: Payer: Self-pay | Admitting: Physical Therapy

## 2020-11-01 ENCOUNTER — Ambulatory Visit: Payer: Medicare HMO | Admitting: Physical Therapy

## 2020-11-01 DIAGNOSIS — M6281 Muscle weakness (generalized): Secondary | ICD-10-CM | POA: Diagnosis not present

## 2020-11-01 DIAGNOSIS — R609 Edema, unspecified: Secondary | ICD-10-CM | POA: Diagnosis not present

## 2020-11-01 DIAGNOSIS — R2689 Other abnormalities of gait and mobility: Secondary | ICD-10-CM | POA: Diagnosis not present

## 2020-11-01 DIAGNOSIS — R2681 Unsteadiness on feet: Secondary | ICD-10-CM

## 2020-11-01 NOTE — Therapy (Signed)
[place]. [place], [place], [address] Phone: [phone]   Fax:  [phone]  Physical Therapy Treatment  Patient Details  Name: Johnny Woods MRN: [id number] Date of Birth: October 26, 1940 Referring Provider (PT): Chambliss   Encounter Date: 11/01/2020   PT End of Session - 11/01/20 1054    Visit Number 2    Number of Visits 17    Date for PT Re-Evaluation 12/14/20    Authorization Type [employer]    PT Start Time 7412    PT Stop Time 1054    PT Time Calculation (min) 40 min    Activity Tolerance Patient limited by pain;Patient tolerated treatment well;Patient limited by fatigue    Behavior During Therapy [facility] for tasks assessed/performed           Past Medical History:  Diagnosis Date  . Atrial fibrillation ([place])   . CHF (congestive heart failure) ([place])   . Chronic cor pulmonale (HCC)   . Chronic diastolic heart failure ([place])   . COPD (chronic obstructive pulmonary disease) ([place])   . Emphysema lung ([place])   . Hypertension   . Lung cancer ([place]) 2020   Stage 3  . Permanent atrial fibrillation ([place])   . Primary hypertension     Past Surgical History:  Procedure Laterality Date  . ABLATION    . BACK SURGERY    . CHOLECYSTECTOMY    . PACEMAKER INSERTION  1997  . ruptured testicle      There were no vitals filed for this visit.   Subjective Assessment - 11/01/20 1018    Subjective Pt reports no significant changes since last rx. States he has been checking BP ~1x/week and has been around ~120/60 for the most part. Pt states that he has been doing his ex's from eval with no trouble.    Currently in Pain? Yes    Pain Score 5     Pain Location Leg    Pain Orientation Right;Left                             [facility] Adult PT Treatment/Exercise - 11/01/20 0001      Exercises   Exercises Lumbar;Knee/Hip      Lumbar Exercises: Aerobic   UBE (Upper Arm Bike) L2 x 2 min each way    rest break halfway through d/t SOB   Nustep L4 x 6 min LE/UE   breaks every ~2 min; RPE ranging from 4-8/10     Knee/Hip Exercises: Seated   Long Arc Quad Both;2 sets;10 reps    Long Arc Quad Weight 2 lbs.    Ball Squeeze x15 3 sec hold    Clamshell with TheraBand Red   x15   Marching Both;2 sets;10 reps    Marching Weights 2 lbs.    Hamstring Curl Both;2 sets;10 reps    Hamstring Limitations red TB   increased difficulty with full range RLE   Sit to Sand 3 sets;5 reps;without UE support   holding 3# dumbbell; RPE from 5-6/10 after each set; increased difficulty standing w/o UE support on 3rd set                   PT Short Term Goals - 11/01/20 1052      PT SHORT TERM GOAL #1   Title Independent with initial HEP    Time 2    Period Weeks  Status Achieved    Target Date 11/02/20             PT Long Term Goals - 10/19/20 1757      PT LONG TERM GOAL #1   Title Independent with advanced HEP    Time 8    Period Weeks    Status New    Target Date 12/14/20      PT LONG TERM GOAL #2   Title TUG </= 12 seconds with Borg dyspnea scale report of </= 3/10    Baseline 13 seconds, dyspnea 5-6/10    Time 8    Period Weeks    Status New    Target Date 12/14/20      PT LONG TERM GOAL #3   Title 5TSTS to be completed in less than 13 seconds with no greater than 3-4/10 dyspnea reported to demo improved functional strength and endurance    Time 8    Period Weeks    Status New    Target Date 12/14/20      PT LONG TERM GOAL #4   Title Gait speed >/= 0.7 m/s to demo improvedfunctional ambulation with dyspnea </= 3/10.    Baseline 0.465 m/s    Time 8    Period Weeks    Status New    Target Date 12/14/20                 Plan - 11/01/20 1055    Clinical Impression Statement Pt tolerated progression to TE fairly well. Limited in standing ex's d/t LE knee/leg pain. Neg Homan's BLE and neg for tenderness posterior gastroc. O2 stayed at 100% throughout session.  Pt did require frequent rest breaks d/t SOB/endurance deficits. RPE ranging from 6-8/10 post exercise and returning to 4-5/10 after ~30 sec-1 min rest. Fatigues quickly with STS with increased reliance on UEs by the end of the 3rd set. Continue to progress functional strength, balance, and endurance.    PT Treatment/Interventions Functional mobility training;Therapeutic activities;Therapeutic exercise;Balance training;Neuromuscular re-education;Gait training;Stair training;Patient/family education;Manual techniques;Energy conservation;Cryotherapy;Moist Heat    PT Next Visit Plan Monitor VS throughout session and assess RPE as needed. Begin with low reps and breaks in between sets for recovery, continue educaiton on pursed lip breathing. Gradually progress strength balance and endurance training as tolerated.    Consulted and Agree with Plan of Care Patient           Patient will benefit from skilled therapeutic intervention in order to improve the following deficits and impairments:  Abnormal gait,Difficulty walking,Increased muscle spasms,Decreased endurance,Cardiopulmonary status limiting activity,Decreased activity tolerance,Pain,Improper body mechanics,Decreased balance,Decreased mobility,Decreased strength,Increased edema  Visit Diagnosis: Muscle weakness (generalized)  Other abnormalities of gait and mobility  Unsteadiness on feet  Edema, unspecified type     Problem List Patient Active Problem List   Diagnosis Date Noted  . Pacemaker: Medtronic Versa Dr single chamber pacemaker  ([employer] Change 01/30/96) 10/28/2020  . Chronic cor pulmonale ([place]) 10/28/2020  . Primary hypertension 10/28/2020  . Chronic diastolic (congestive) heart failure ([place]) 10/28/2020  . Permanent atrial fibrillation ([place]) 10/28/2020  . Depression, recurrent ([place]) 10/19/2020  . Diabetes mellitus without complication ([place]) 67/20/9470  . CHF exacerbation ([place]) 10/05/2020  . CHF (congestive heart failure) ([place])  10/05/2020   Amador Cunas, PT, DPT Donald Prose Basha Krygier 11/01/2020, 10:58 AM  [place]. [place], [place], [address] Phone: [phone]   Fax:  [phone]  Name: Malikiah Debarr Buikema MRN: [id number] Date of  Birth: 1941/07/30

## 2020-11-04 ENCOUNTER — Ambulatory Visit: Payer: Medicare HMO

## 2020-11-04 ENCOUNTER — Other Ambulatory Visit: Payer: Self-pay

## 2020-11-04 DIAGNOSIS — R2681 Unsteadiness on feet: Secondary | ICD-10-CM

## 2020-11-04 DIAGNOSIS — R609 Edema, unspecified: Secondary | ICD-10-CM

## 2020-11-04 DIAGNOSIS — R2689 Other abnormalities of gait and mobility: Secondary | ICD-10-CM

## 2020-11-04 DIAGNOSIS — M6281 Muscle weakness (generalized): Secondary | ICD-10-CM | POA: Diagnosis not present

## 2020-11-04 NOTE — Therapy (Signed)
[place]. [place], [place], [address] Phone: [phone]   Fax:  [phone]  Physical Therapy Treatment  Patient Details  Name: Johnny Woods MRN: [id number] Date of Birth: [birth year] Referring Provider (PT): Chambliss   Encounter Date: 11/04/2020   PT End of Session - 11/04/20 1020    Visit Number 3    Number of Visits 17    Date for PT Re-Evaluation 12/14/20    Authorization Type [employer]    PT Start Time 1017    PT Stop Time 1100    PT Time Calculation (min) 43 min    Activity Tolerance Patient limited by pain;Patient tolerated treatment well;Patient limited by fatigue    Behavior During Therapy [facility] for tasks assessed/performed           Past Medical History:  Diagnosis Date  . Atrial fibrillation ([place])   . CHF (congestive heart failure) ([place])   . Chronic cor pulmonale (HCC)   . Chronic diastolic heart failure ([place])   . COPD (chronic obstructive pulmonary disease) ([place])   . Emphysema lung ([place])   . Hypertension   . Lung cancer ([place]) 2020   Stage 3  . Permanent atrial fibrillation ([place])   . Primary hypertension     Past Surgical History:  Procedure Laterality Date  . ABLATION    . BACK SURGERY    . CHOLECYSTECTOMY    . PACEMAKER INSERTION  1997  . ruptured testicle      There were no vitals filed for this visit.   Subjective Assessment - 11/04/20 1019    Subjective Pt reports no significant changes since last rx. States he has been checking BP ~1x/week and has been around ~120/60 for the most part. Pt states that he has been doing his ex's from eval with no trouble. Got outside and did some things yesterday, just get tired and SOB    Pertinent History AKI, Anemia, HTN, Sick sinus syndrome with a Pacemaker, Afib s/p ablation, 2 yrs Stage 3 Non-small cell lung cancer under immunotherapy (treated at [place]), GERD, COPD, Back injury with operation in 1960s. Chemo induced peripheral neuropathy     Limitations Walking    Patient Stated Goals to improve strength and weight,  improve endurance, decrease pain    Currently in Pain? Yes    Pain Score 5     Pain Location Leg    Pain Orientation Right;Left             [facility] Adult PT Treatment/Exercise - 11/04/20 0001      Lumbar Exercises: Aerobic   UBE (Upper Arm Bike) L2 x 2 min each way   rest at 2 min mark d/t SOB   Nustep L3 x 6 min - short break every 2 minutes RPE range 4-7/10      Knee/Hip Exercises: Standing   Other Standing Knee Exercises 5 x 2 alternating marches, alternating lateral toe taps alt backwards step with BUE support.      Knee/Hip Exercises: Seated   Long Arc Quad Both;2 sets;10 reps    [employer] x15 3 sec hold    Clamshell with TheraBand Red   2 x 10   Marching Both;2 sets;10 reps    Marching Weights 2 lbs.    Hamstring Curl Both;2 sets;10 reps    Hamstring Limitations red TB    Sit to Sand 3 sets;5 reps;without UE support   holding 3# dumbbell; RPE from 5-6/10 after each set;  improved tolerance by 3rd set            PT Short Term Goals - 11/01/20 1052      PT SHORT TERM GOAL #1   Title Independent with initial HEP    Time 2    Period Weeks    Status Achieved    Target Date 11/02/20             PT Long Term Goals - 10/19/20 1757      PT LONG TERM GOAL #1   Title Independent with advanced HEP    Time 8    Period Weeks    Status New    Target Date 12/14/20      PT LONG TERM GOAL #2   Title TUG </= 12 seconds with Borg dyspnea scale report of </= 3/10    Baseline 13 seconds, dyspnea 5-6/10    Time 8    Period Weeks    Status New    Target Date 12/14/20      PT LONG TERM GOAL #3   Title 5TSTS to be completed in less than 13 seconds with no greater than 3-4/10 dyspnea reported to demo improved functional strength and endurance    Time 8    Period Weeks    Status New    Target Date 12/14/20      PT LONG TERM GOAL #4   Title Gait speed >/= 0.7 m/s to demo  improvedfunctional ambulation with dyspnea </= 3/10.    Baseline 0.465 m/s    Time 8    Period Weeks    Status New    Target Date 12/14/20                 Plan - 11/04/20 1020    Clinical Impression Statement Pt tolerated todays exercises fairly well. BP HR and O2 stable throughout session. Continued rest breaks as needed to manage SOB/fatigue. He was able to tolerate some standing exercises with BUE support today with minimal LE pain exacerbation, SOB 3-4/10. Tolerated nustep end of session with short break to manage SOB and LE pain at 3 minutes today. Will benefit from continued cardio conditioning, functional endurance  strength and balance    Personal Factors and Comorbidities Age;Past/Current Experience;Time since onset of injury/illness/exacerbation;Comorbidity 3+;Fitness    Rehab Potential Good    PT Treatment/Interventions Functional mobility training;Therapeutic activities;Therapeutic exercise;Balance training;Neuromuscular re-education;Gait training;Stair training;Patient/family education;Manual techniques;Energy conservation;Cryotherapy;Moist Heat    PT Next Visit Plan Monitor VS throughout session and assess RPE as needed. Begin with low reps and breaks in between sets for recovery, continue educaiton on pursed lip breathing. Gradually progress strength balance and endurance training as tolerated. Update initial HEP when approrpiate.    Consulted and Agree with Plan of Care Patient           Patient will benefit from skilled therapeutic intervention in order to improve the following deficits and impairments:  Abnormal gait,Difficulty walking,Increased muscle spasms,Decreased endurance,Cardiopulmonary status limiting activity,Decreased activity tolerance,Pain,Improper body mechanics,Decreased balance,Decreased mobility,Decreased strength,Increased edema  Visit Diagnosis: Muscle weakness (generalized)  Other abnormalities of gait and mobility  Unsteadiness on  feet  Edema, unspecified type     Problem List Patient Active Problem List   Diagnosis Date Noted  . Pacemaker: Medtronic Versa Dr single chamber pacemaker  (Gen Change 01/30/96) 10/28/2020  . Chronic cor pulmonale ([place]) 10/28/2020  . Primary hypertension 10/28/2020  . Chronic diastolic (congestive) heart failure ([place]) 10/28/2020  . Permanent atrial fibrillation ([place]) 10/28/2020  .  Depression, recurrent ([place]) 10/19/2020  . Diabetes mellitus without complication ([place]) 25/63/8937  . CHF exacerbation ([place]) 10/05/2020  . CHF (congestive heart failure) ([place]) 10/05/2020    Hall Busing, PT, DPT 11/04/2020, 10:57 AM  [place]. [place], [place], [address] Phone: [phone]   Fax:  [phone]  Name: Johnny Woods MRN: [id number] Date of Birth: 11-Dec-1940

## 2020-11-08 ENCOUNTER — Ambulatory Visit: Payer: Medicare HMO

## 2020-11-08 ENCOUNTER — Other Ambulatory Visit: Payer: Self-pay

## 2020-11-08 DIAGNOSIS — R2681 Unsteadiness on feet: Secondary | ICD-10-CM

## 2020-11-08 DIAGNOSIS — R2689 Other abnormalities of gait and mobility: Secondary | ICD-10-CM | POA: Diagnosis not present

## 2020-11-08 DIAGNOSIS — M6281 Muscle weakness (generalized): Secondary | ICD-10-CM

## 2020-11-08 DIAGNOSIS — R609 Edema, unspecified: Secondary | ICD-10-CM | POA: Diagnosis not present

## 2020-11-08 NOTE — Therapy (Signed)
[place]. [place], [place], [address] Phone: [phone]   Fax:  [phone]  Physical Therapy Treatment  Patient Details  Name: Johnny Woods MRN: [id number] Date of Birth: 01-Dec-1940 Referring Provider (PT): Chambliss   Encounter Date: 11/08/2020   PT End of Session - 11/08/20 1016    Visit Number 3    Number of Visits 17    Date for PT Re-Evaluation 12/14/20    Authorization Type [employer]    PT Start Time 7858    PT Stop Time 1055    PT Time Calculation (min) 40 min    Activity Tolerance Patient limited by pain;Patient tolerated treatment well;Patient limited by fatigue    Behavior During Therapy [facility] for tasks assessed/performed           Past Medical History:  Diagnosis Date  . Atrial fibrillation ([place])   . CHF (congestive heart failure) ([place])   . Chronic cor pulmonale (HCC)   . Chronic diastolic heart failure ([place])   . COPD (chronic obstructive pulmonary disease) ([place])   . Emphysema lung ([place])   . Hypertension   . Lung cancer ([place]) 2020   Stage 3  . Permanent atrial fibrillation ([place])   . Primary hypertension     Past Surgical History:  Procedure Laterality Date  . ABLATION    . BACK SURGERY    . CHOLECYSTECTOMY    . PACEMAKER INSERTION  1997  . ruptured testicle      There were no vitals filed for this visit.   Subjective Assessment - 11/08/20 1015    Subjective Pt reports no significant changes since last session. Pt states that he has been doing his ex's from eval with no trouble. Just get tired and SOB but not too bad    Pertinent History AKI, Anemia, HTN, Sick sinus syndrome with a Pacemaker, Afib s/p ablation, 2 yrs Stage 3 Non-small cell lung cancer under immunotherapy (treated at [place]), GERD, COPD, Back injury with operation in 1960s. Chemo induced peripheral neuropathy    Limitations Walking    Patient Stated Goals to improve strength and weight,  improve endurance, decrease  pain    Currently in Pain? Yes    Pain Score 5     Pain Location Leg    Pain Orientation Right;Left                 [facility] Adult PT Treatment/Exercise - 11/08/20 0001      Lumbar Exercises: Aerobic   UBE (Upper Arm Bike) L2 x 2 min each way   did not take rest at 2 minute mark today, felt okay. reported ncr SOB 5-6/10 at 4 minutes.   Nustep L3 x 6 min - short break every 2 minutes RPE range 4-7/10      Lumbar Exercises: Seated   Other Seated Lumbar Exercises Scap retractions with inhalation, relax with exhalation 5 x 3      Knee/Hip Exercises: Standing   Other Standing Knee Exercises 5 x 2 alternating marches, alternating lateral toe taps alt backwards step with BUE support.      Knee/Hip Exercises: Seated   Long Arc Quad Both;2 sets;10 reps    [employer] x15 3 sec hold    Clamshell with TheraBand Red   2 x 10   Other Seated Knee/Hip Exercises Heel/toe raises 10 x 2 B    Marching Both;2 sets;10 reps    Marching Weights --    Hamstring Curl --  Hamstring Limitations --    Sit to Sand 3 sets;5 reps;without UE support   holding 3# dumbbell; RPE from 5-6/10 after each set             PT Short Term Goals - 11/01/20 1052      PT SHORT TERM GOAL #1   Title Independent with initial HEP    Time 2    Period Weeks    Status Achieved    Target Date 11/02/20             PT Long Term Goals - 10/19/20 1757      PT LONG TERM GOAL #1   Title Independent with advanced HEP    Time 8    Period Weeks    Status New    Target Date 12/14/20      PT LONG TERM GOAL #2   Title TUG </= 12 seconds with Borg dyspnea scale report of </= 3/10    Baseline 13 seconds, dyspnea 5-6/10    Time 8    Period Weeks    Status New    Target Date 12/14/20      PT LONG TERM GOAL #3   Title 5TSTS to be completed in less than 13 seconds with no greater than 3-4/10 dyspnea reported to demo improved functional strength and endurance    Time 8    Period Weeks    Status New    Target  Date 12/14/20      PT LONG TERM GOAL #4   Title Gait speed >/= 0.7 m/s to demo improvedfunctional ambulation with dyspnea </= 3/10.    Baseline 0.465 m/s    Time 8    Period Weeks    Status New    Target Date 12/14/20                 Plan - 11/08/20 1046    Clinical Impression Statement Pt tolerated todays exercises fairly well. BP HR and O2 stable throughout session. Continued rest breaks as needed to manage SOB/fatigue. He had a little back pain with sit to stand that dissipated with seated rest. Alot of education provided on posture and breating today as he frequently looks down  with rounded shoulders - with postural cues able to get a bit better chest expansion with PLB/Diaphragmatic breaths, does need some cues for breath pacing. He was able to tolerate some standing exercises with BUE support today with minimal LE pain exacerbation, SOB 3-4/10.  Will benefit from continued cardio conditioning, functional endurance strength and balance    Personal Factors and Comorbidities Age;Past/Current Experience;Time since onset of injury/illness/exacerbation;Comorbidity 3+;Fitness    Rehab Potential Good    PT Frequency 2x / week    PT Treatment/Interventions Functional mobility training;Therapeutic activities;Therapeutic exercise;Balance training;Neuromuscular re-education;Gait training;Stair training;Patient/family education;Manual techniques;Energy conservation;Cryotherapy;Moist Heat    PT Next Visit Plan Monitor VS throughout session and assess RPE/SOB  as needed. Begin with low reps and breaks in between sets for recovery, continue educaiton on pursed lip breathing. Gradually progress strength balance and endurance training as tolerated. Update initial HEP when approrpiate.    Consulted and Agree with Plan of Care Patient           Patient will benefit from skilled therapeutic intervention in order to improve the following deficits and impairments:  Abnormal gait,Difficulty  walking,Increased muscle spasms,Decreased endurance,Cardiopulmonary status limiting activity,Decreased activity tolerance,Pain,Improper body mechanics,Decreased balance,Decreased mobility,Decreased strength,Increased edema  Visit Diagnosis: Muscle weakness (generalized)  Other abnormalities of gait and mobility  Unsteadiness on feet  Edema, unspecified type     Problem List Patient Active Problem List   Diagnosis Date Noted  . Pacemaker: Medtronic Versa Dr single chamber pacemaker  (Gen Change 01/30/96) 10/28/2020  . Chronic cor pulmonale ([place]) 10/28/2020  . Primary hypertension 10/28/2020  . Chronic diastolic (congestive) heart failure ([place]) 10/28/2020  . Permanent atrial fibrillation ([place]) 10/28/2020  . Depression, recurrent ([place]) 10/19/2020  . Diabetes mellitus without complication ([place]) 50/27/7412  . CHF exacerbation ([place]) 10/05/2020  . CHF (congestive heart failure) ([place]) 10/05/2020    Hall Busing, PT, DPT 11/08/2020, 10:53 AM  [place]. [place], [place], [address] Phone: [phone]   Fax:  [phone]  Name: Chayim Bialas [place] MRN: [id number] Date of Birth: 07-14-1941

## 2020-11-11 ENCOUNTER — Ambulatory Visit: Payer: Medicare HMO

## 2020-11-15 ENCOUNTER — Ambulatory Visit: Payer: Medicare HMO | Admitting: Physical Therapy

## 2020-11-18 ENCOUNTER — Ambulatory Visit: Payer: Medicare HMO

## 2020-12-06 IMAGING — DX DG CHEST 2V
2 series · 2 of 2 positions shown · non-contrast
Comparison: Chest radiographs 09/19/2015.

CLINICAL DATA: 78-year-old male with 2 days of shortness of breath
productive cough and congestion.

EXAM:
CHEST - 2 VIEW

[chest pa]
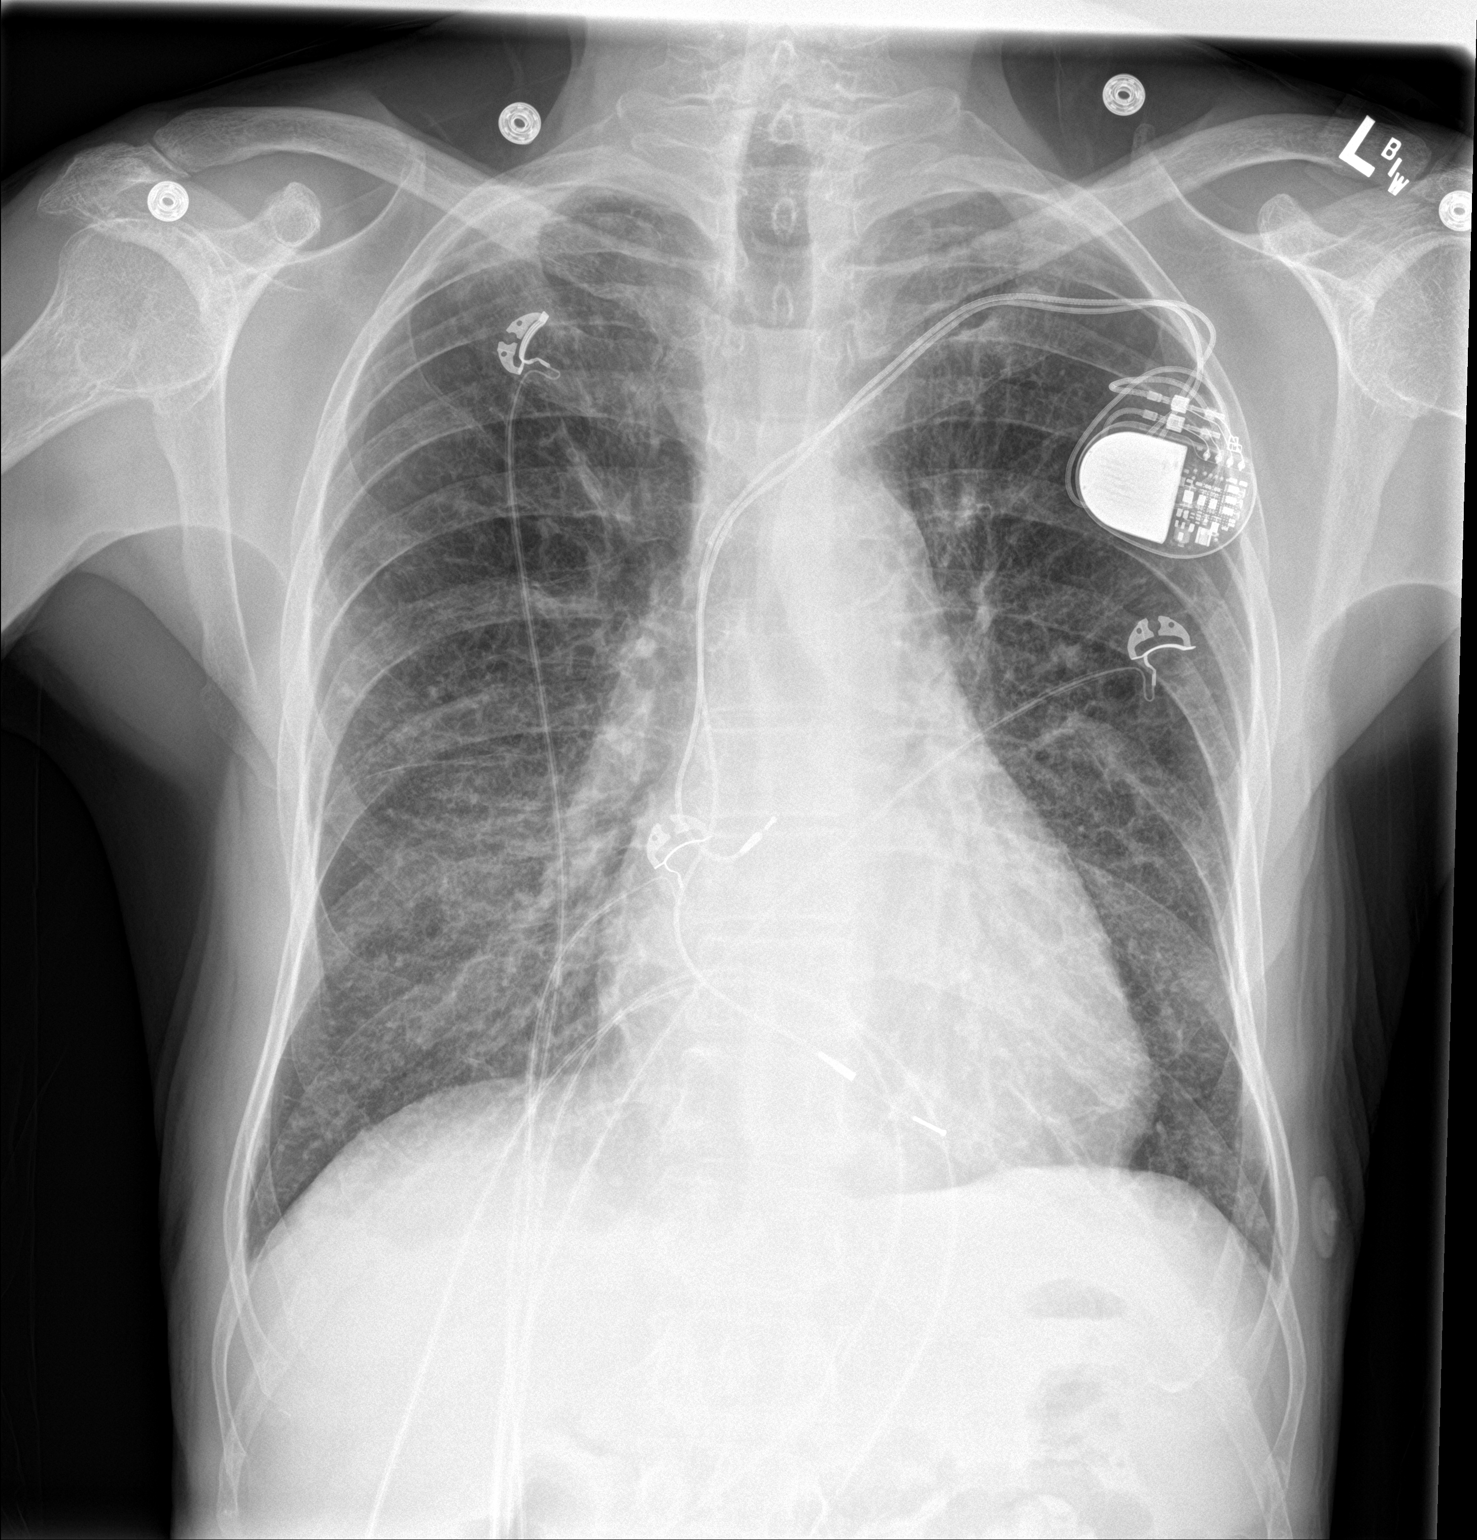

[chest lat]
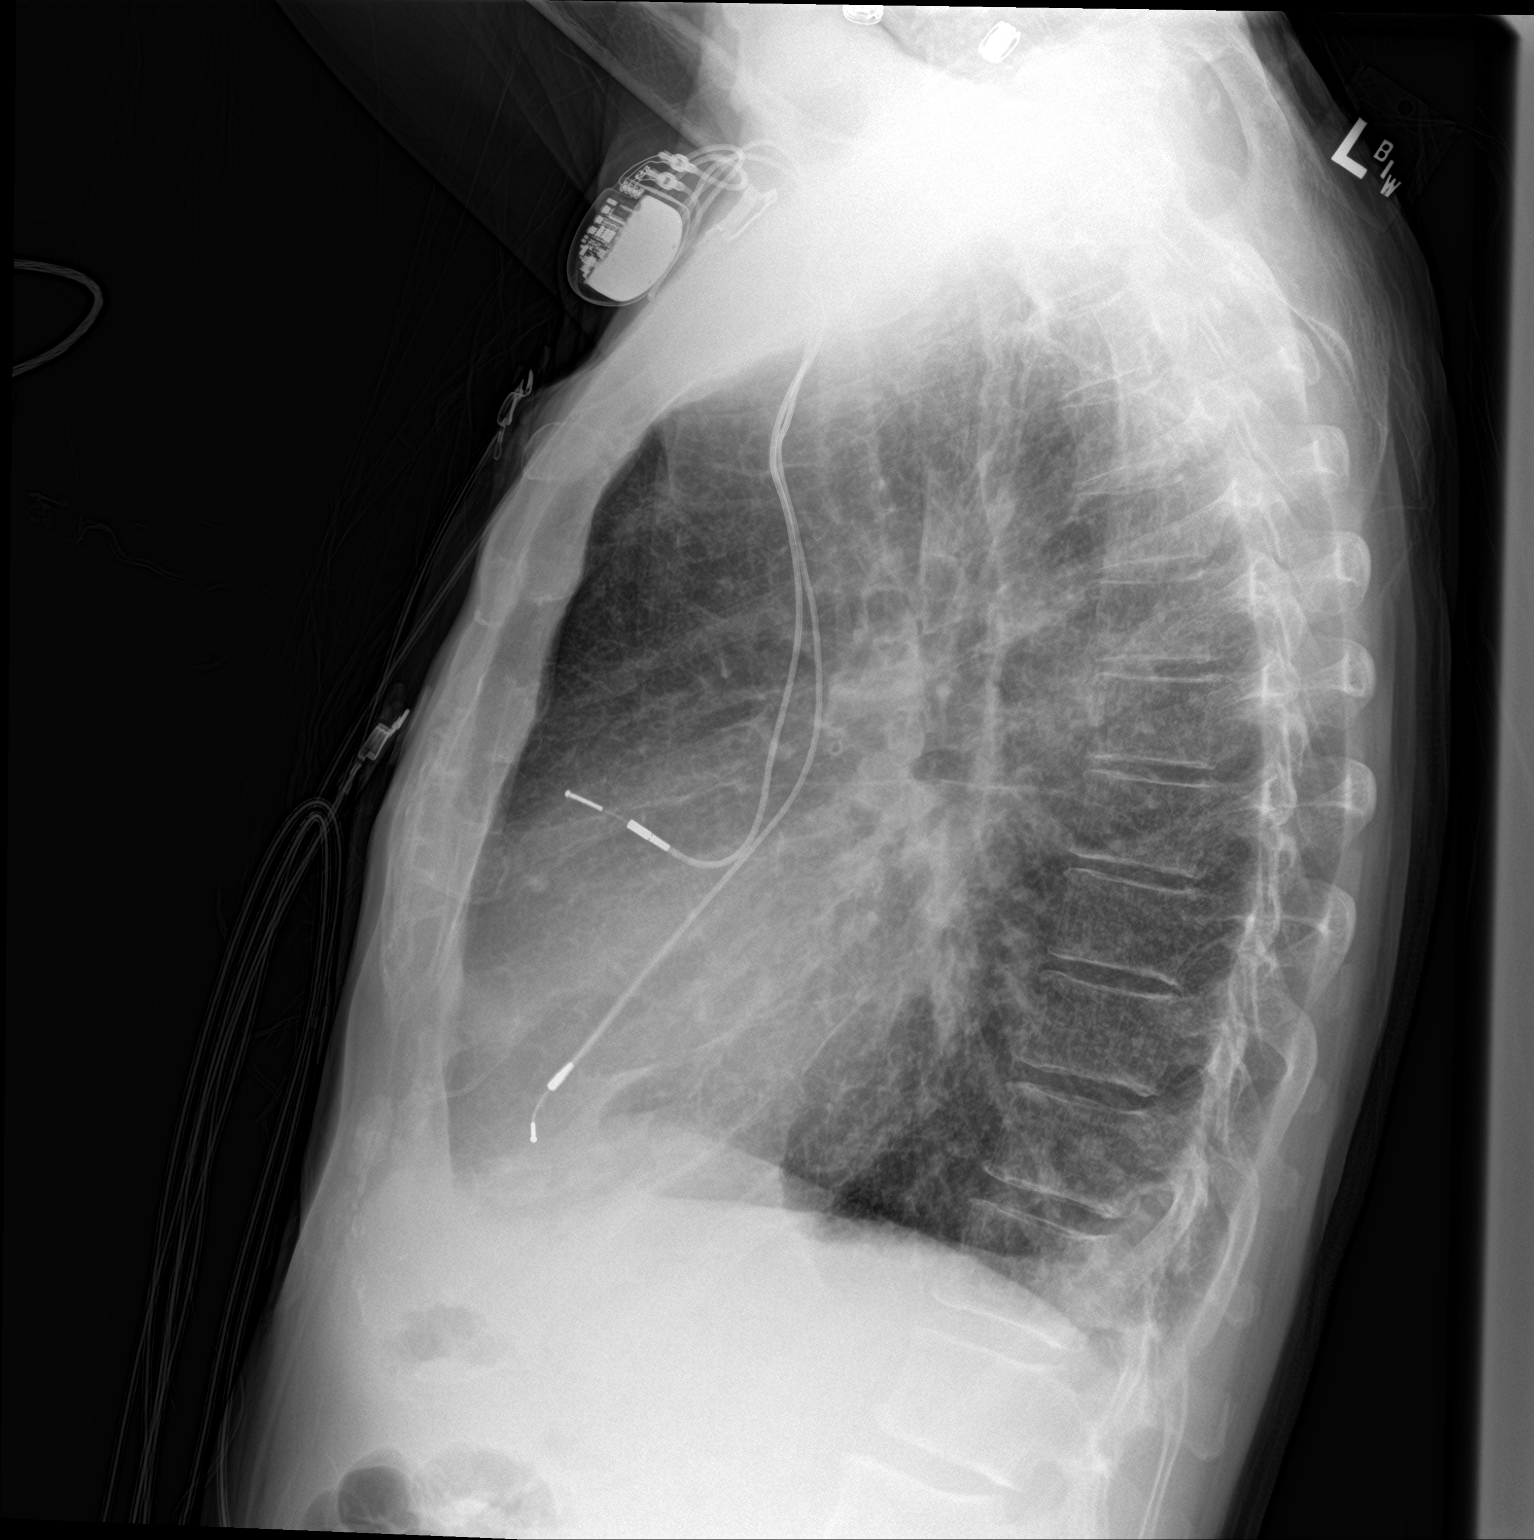

[2 of 2 positions shown; findings below may reference images not displayed]

FINDINGS: Stable lung volumes and mediastinal contours. Cardiac size at the
upper limits of normal. Stable chronic left chest cardiac pacemaker.
Small bilateral pleural effusions in 0136 are regressed or resolved.
Diffuse increased pulmonary interstitial markings appear stable.
Chronic upper lobe scarring greater on the right. No pneumothorax,
consolidation or areas of worsening ventilation. No acute osseous
abnormality identified. Negative visible bowel gas pattern.
IMPRESSION: 1. Diffuse pulmonary interstitial markings are stable compared to
0136. Top differential considerations include chronic interstitial
lung disease, recurrent acute viral/atypical respiratory infection.
Interstitial edema felt less likely.
2. Small pleural effusions in 0136 have regressed or resolved.
3. Borderline to mild cardiomegaly.

## 2021-05-03 ENCOUNTER — Ambulatory Visit: Payer: No Typology Code available for payment source | Admitting: Cardiology

## 2023-11-13 DIAGNOSIS — I5033 Acute on chronic diastolic (congestive) heart failure: Secondary | ICD-10-CM | POA: Diagnosis not present

## 2023-11-13 DIAGNOSIS — G894 Chronic pain syndrome: Secondary | ICD-10-CM | POA: Diagnosis not present

## 2023-11-13 DIAGNOSIS — J440 Chronic obstructive pulmonary disease with acute lower respiratory infection: Secondary | ICD-10-CM | POA: Diagnosis not present

## 2023-11-13 DIAGNOSIS — N184 Chronic kidney disease, stage 4 (severe): Secondary | ICD-10-CM | POA: Diagnosis not present

## 2023-11-13 DIAGNOSIS — I130 Hypertensive heart and chronic kidney disease with heart failure and stage 1 through stage 4 chronic kidney disease, or unspecified chronic kidney disease: Secondary | ICD-10-CM | POA: Diagnosis not present

## 2023-11-13 DIAGNOSIS — J158 Pneumonia due to other specified bacteria: Secondary | ICD-10-CM | POA: Diagnosis not present

## 2023-11-13 DIAGNOSIS — J441 Chronic obstructive pulmonary disease with (acute) exacerbation: Secondary | ICD-10-CM | POA: Diagnosis not present

## 2023-11-13 DIAGNOSIS — Z66 Do not resuscitate: Secondary | ICD-10-CM | POA: Diagnosis not present

## 2023-11-13 DIAGNOSIS — J9601 Acute respiratory failure with hypoxia: Secondary | ICD-10-CM | POA: Diagnosis not present

## 2023-11-13 NOTE — ED Provider Notes (Signed)
 [facility]  ED Provider Note  Johnny Woods 83 y.o. male DOB: 23-Dec-1940 MRN: [id number] History   Chief Complaint  Patient presents with  . Shortness of Breath    Pt BIB ems from [facility] for worsening shob over the last three days, has had cough/shob for several months. Uses albuterol  neb at home with minimal relief. Per [facility], pt was in 70s on RA, 96% on 4L. Pt received duoneb en route. Hx of lung cx, CHF, COPD.   Patient comes today with cough and shortness of breath.  He had a cough for about 3 to 4 days.  Shortness of breath for the same amount of time.  He tells me his lung cancer, CHF, COPD.  He went to the [facility] across the street and they sent him here via EMS.  Supposedly his room air saturation was in the 70s there.  He does not wear oxygen at home.  He has had no fever.  No chest pain.  He has vomited a couple times after he got into a coughing fit.  No belly pain or back pain.  No leg pain or swelling.  He is anticoagulated with Eliquis .   History provided by:  Patient and EMS personnel Language interpreter used: No   Shortness of Breath Associated symptoms: no abdominal pain, no chest pain, no headaches, no neck pain, no rash and no vomiting       Past Medical History:  Diagnosis Date  . Atrial fibrillation (*)   . CHF (congestive heart failure) (*)   . CKD (chronic kidney disease)   . COPD (chronic obstructive pulmonary disease) (*)   . Diabetes mellitus (*)   . Hypertension   . Lung cancer (*)     History reviewed. No pertinent surgical history.  Social History   Substance and Sexual Activity  Alcohol Use Not Currently   Social History   Tobacco Use  Smoking Status Every Day  . Types: Cigarettes  . Start date: 1961  Smokeless Tobacco Not on file  Tobacco Comments   Pt has quit several times for total of 14 years;58-14=44   E-Cigarettes  . Vaping Use    . Start Date    . Cartridges/Day    . Quit Date     Social  History   Substance and Sexual Activity  Drug Use Never         Allergies  Allergen Reactions  . Amlodipine Unknown    Home Medications   ALBUTEROL  SULFATE HFA (PROVENTIL ,VENTOLIN ,PROAIR ) 108 (90 BASE) MCG/ACT INHALER    Inhale two puffs into the lungs every 6 (six) hours as needed for Wheezing.   APIXABAN  (ELIQUIS ) 2.5 MG TABLET    Take one tablet (2.5 mg dose) by mouth 2 (two) times daily.   CARVEDILOL (COREG) 3.125 MG TABLET    Take one tablet (3.125 mg dose) by mouth 2 (two) times daily.   FUROSEMIDE  (LASIX ) 40 MG TABLET    Take one tablet (40 mg dose) by mouth daily.   GLYBURIDE (DIABETA) 5 MG TABLET    Take one tablet (5 mg dose) by mouth with breakfast.   UMECLIDINIUM BROMIDE (INCRUSE ELLIPTA) 62.5 MCG/INH INHALATION POWDER    Inhale one puff into the lungs daily.    Primary Survey  Primary Survey  Review of Systems   Review of Systems  Constitutional:  Negative for chills and fever.  Respiratory:  Positive for cough and shortness of breath.   Cardiovascular:  Negative  for chest pain, palpitations and leg swelling.  Gastrointestinal:  Negative for abdominal pain, diarrhea, nausea and vomiting.  Genitourinary:  Negative for dysuria.  Musculoskeletal:  Negative for back pain and neck pain.  Skin:  Negative for rash.  Neurological:  Negative for syncope and headaches.    Physical Exam   ED Triage Vitals  BP 11/13/23 0945 (!) 135/59  Heart Rate 11/13/23 0945 77  Resp 11/13/23 0947 18  SpO2 11/13/23 0945 97 %  Temp 11/13/23 0947 97.5 F (36.4 C)    Physical Exam  Nursing note and vitals reviewed. Constitutional: He appears well-developed.  Thin, elderly, chronically ill-appearing  HENT:  Head: Normocephalic and atraumatic.  Mouth/Throat: Voice normal.  Eyes: EOM are intact. Pupils are equal, round, and reactive to light.  Neck: Normal range of motion and voice normal. Neck supple.  Cardiovascular: Normal rate, regular rhythm, normal heart sounds and intact  distal pulses.  Pulmonary/Chest: Decreased air movement noted. Respiratory effort normal. Wheezing.  Abdominal: Soft. There is no abdominal tenderness. There is no guarding. Bowel sounds are normal.  Musculoskeletal: Normal range of motion.     Cervical back: Normal range of motion and neck supple. no edema.  Neurological: He is alert and oriented to person, place, and time. Moves all extremities equally. He has normal speech. Strength 5/5 bilateral upper and lower extremities.  Skin: Skin is warm. Skin is dry.  Psychiatric: He has a normal mood and affect.    ED Course   Lab results:   CBC AND DIFFERENTIAL - Abnormal      Result Value   WBC 4.9     RBC 3.84 (*)    HGB 12.2 (*)    HCT 37.4 (*)    MCV 97.4 (*)    MCH 31.8     MCHC 32.6     Plt Ct 169     RDW SD 54.8 (*)    MPV 10.5     NRBC% 0.0     Absolute NRBC Count 0.00     NEUTROPHIL % 69.6     LYMPHOCYTE % 14.2     MONOCYTE % 14.8     Eosinophil % 0.2     BASOPHIL % 0.8     IG% 0.4     ABSOLUTE NEUTROPHIL COUNT 3.44     ABSOLUTE LYMPHOCYTE COUNT 0.70 (*)    Absolute Monocyte Count 0.73     Absolute Eosinophil Count 0.01     Absolute Basophil Count 0.04     Absolute Immature Granulocyte Count 0.02    GEN5 CARDIAC TROPONIN T (TNT5) BASELINE - Abnormal   TnT-Gen5 (0hr) 73 (*)    Comment: An elevated Troponin indicates myocardial damage. Elevated troponin may also be due to pulmonary emboli, aortic dissection, heart failure, trauma, toxins and ischemia in the setting of critical illness.   COMPREHENSIVE METABOLIC PANEL - Abnormal   Na 136     Potassium 4.1     Cl 98     CO2 25     AGAP 13     Glucose 127 (*)    BUN 48 (*)    Creatinine 2.63 (*)    Ca 9.8     ALK PHOS 106     T Bili 0.9     Total Protein 7.9     Alb 4.3     GLOBULIN 3.6     ALBUMIN/GLOBULIN RATIO 1.2     BUN/CREAT RATIO 18.3     ALT 12  AST 22     eGFR 24 (*)    Comment: Normal GFR (glomerular filtration rate) > 60 mL/min/1.73  meters squared, < 60 may include impaired kidney function. Calculation based on the Chronic Kidney Disease Epidemiology Collaboration (CK-EPI)equation refit without adjustment for race.  MAGNESIUM - Abnormal   Mg 2.7 (*)   NT-PROBNP - Abnormal   NT-ProBNP 9,041 (*)    Comment: Among patients with dyspnea, NT-proBNPis highly sensitive for the detection of acute congestive heart failure.  In addition, a NT-proBNP <300 pg/mL effectively rules out acute congestive heart failure, with 98% negative predictive value.   PROCALCITONIN - Abnormal   Procalcitonin 0.38 (*)    Narrative:    PCT  0.09 - 0.50 ------- Low risk for progression to severe systemic infection (severe sepsis/septic shock). Caution:  PCT levels below 0.50 ng.mL do not exclude an infection, because localized infections (without system signs) may be associated with such low levels.  If PCT is measured very early after a bacterial challenge (usually <6 hours), these values may still be low.  In this case, PCT should be re-assessed 6-24 hours later.  >0.50 - 2 --------- Moderate risk for progression to severe systemic infection (severe sepsis/septic shock). The patient should be closely monitored both clinically and by re-assessing PCT within 6-24 hours.  >2 - <10 --------- High risk for progression to severe systemic infection (severe sepsis/septic shock).  >=10 ------------- High likelihood of severe sepsis or septic shock.   GEN5 CARDIAC TROPONIN T(TNT5) 1 HOUR - Abnormal   TnT-Gen5 (1hr) 63 (*)    Comment: An elevated Troponin indicates myocardial damage. Elevated troponin may also be due to pulmonary emboli, aortic dissection, heart failure, trauma, toxins and ischemia in the setting of critical illness.   Delta 1 Hour -10    GEN5 CARDIAC TROPONIN T(TNT5) 3 HOUR - Abnormal   TnT-Gen5 (3hr) 60 (*)    Comment: An elevated Troponin indicates myocardial damage. Elevated troponin may also be due to pulmonary emboli,  aortic dissection, heart failure, trauma, toxins and ischemia in the setting of critical illness.   Delta 3 Hour -13    BLOOD GAS, ARTERIAL - Abnormal   PH ARTERIAL 7.447     PCO2 35.1     PO2 ARTERIAL 50.2 (*)    HCO3 24     Base Excess 0     OXYGEN SATURATION MEASURED ART 87.2     PATIENT TEMPERATURE 98.6     FRACTION OF INSPIRED OXYGEN 21     VENTILATION MODE ARTERIAL RA     TOTAL RATE ART 22     ALLENS TEST Pass     RESPIRATORY CARE NUMBER OF STICKS BCH 1     SITE RB    COVID-19, FLU A+B AND RSV - Normal   Flu A Negative     Flu B Negative     RSV PCR Negative     SARS-COV-2 Not Detected     Narrative:    SARS-COV-2 (COVID-19)PCR-Negative results do not preclude SARS-CoV-2 infection and should not be used as the sole basis for patient management decisions. Negative results must be combined with clinical observations, patient history, and epidemiological information.  Flu and/or RSV - Negative results do not preclude the presence of Flu or RSV virus and should not be used as the sole basis for treatment or other patient management decisions. False negative results may occur if virus is present at levels below the analytical limit of detection.  This test detects Influenza  A, Influenza B, and Respiratory Syncytial Virus and SARS-COV-2 (COVID-19) by PCR.    Testing was performed using the CEPHEID SARS-CoV-2 EUA ASSAY:  The Cepheid SARS-CoV-2 EUA assay has not been FDA cleared or approved.  It has been authorized by FDA under an Emergency Use Authorization (EUA).  The test has been authorized only for the detection of nucleic acid from SARS-CoV-2, not for any other viruses or pathogens.  It is only authorized for the duration of time the declaration that circumstances exist justifying the authorization of the emergency use of in vitro diagnostic tests for detection of SARS-CoV-2 virus and/or diagnosis of COVID-19 infection under section 564(b) (1) of the Act, 21 U.S.C 360bbb-3 (b) (1),  unless the authorization is terminated or revoked sooner.   Link to Patient Fact Sheet:  ElectronicsManager.it   Link to Provider Fact Sheet  [email or web address].com.[email or web address]    LACTIC ACID - Normal   Lactic Acid 1.2    CULTURE, BLOOD  CULTURE, BLOOD  MRSA PCR    Imaging:   XR CHEST AP PORTABLE   Narrative:    TECHNIQUE:  XR CHEST AP PORTABLE   COMPARISON:   August 12, 2022  INDICATION: Male 83 years old presenting with Shortness of breath Respiratory Distress    FINDINGS:   Emphysematous changes of the lungs. No pneumothorax. Bilateral interstitial infiltrate/edema.  The cardiomediastinal silhouette is normal in size. Pacemaker. No acute bone abnormality.    Impression:    IMPRESSION:  Bilateral interstitial infiltrate/edema.  Electronically Signed by: Dallas Jubilee, MD on 11/13/2023 10:43 AM  ECHOCARDIOGRAM COMPLETE W ENHANCING AGENT      Result Value   LA M-L Dimension (A4C) 6.820     LA S-I Dimension (A2C) 6.430     LA Area Sys (A2C) 26.200     LA Area Sys (A4C) 19.900     LA ESV (A2C) 79.000     LA ESV (A4C) 52.400     LA Volume Index (BP) 55.0     LA A-P Dimension 5.000     Left Atrium Dimension 2D 5.000     LA ESV Index (A4C) 30.600     LA Volume Index (BP) 38.0     LA/Ao Ratio 1.670     RA Major 6.090     RA Volume 51.000     Aortic valve mean velocity 0.885     Aortic valve mean velocity 0.783     AoV Mean Gradient 4.000     Aortic valve velocity time integral 0.273     Aortic valve mean velocity 0.834     AoV Mean Gradient 4.000     AoV Mean Gradient 3.000     AV VMAX 1.270     AV VMAX 1.140     AV VMAX 1.270     AV VMAX 1.140     AoV Peak Velocity 1.210     AoV Peak Gradient 6.000     AVA (VTI) 2.290     AoV Valve Area 2.27     AVA/BSA 1.340     AV index (prosthetic) 0.720     Aortic root 3.000     Ao root annulus 3.000     EF - 3D Echo 55     Left Ventricular EF by 2-D Biplane  by Method of Disks 64.600     Left Ventricular EF by Teichholz Method 56.100     LV Stroke Volume 56.000     LV Stroke Volume 71.700     LVOT  stroke volume 62.000     LV Stroke Volume 60.600     LVEDV 101.000     LVEDV 111.000     LV Diastolic Volume (BP) 108.000     LVES V 45.000     LVES V 39.300     LV Systolic Volume (BP) 47.400     IVSd 0.9     IVS 0.900     LVIDd 4.800     LVIDD 4.80     LVIDs 3.400     Left Ventricular Outflow Tract Cross Section Area 20.000     LVOT Diameter 2.000     LVOT Mean Gradient 2.0     LVOT Peak VTI 19.900     LVOT Mean Vel 57.900     LVOT Peak Velocity 0.873     LVOT Peak Gradient 3.000     LVPWd 0.900     LVPWD 0.900     LV Peak Diastolic Tissue Velocity During (A Sys Lat) 10.000     MV E' Lateral Tissue Vel 10.000     LV Peak Diastolic Tissue Velocity During (A Sys Med) 8.920     MV E' Tissue Velocity Lateral 8.920     Global Strain -18.200     Global Strain -13.300     Global Strain -13.700     Global Strain -15.100     E/E' Lateral Ratio 8.100     E/E' Ratio 9.10     LVFS 29.200     Interventricular Septum/Left Ventricular PWD by 2D 1.000     LVOT Area 3.14     IVC 2.30     MR Vel 511.000     MR Vel 370.000     MR Vel 449.000     Mitral Regurgitant Peak Velocity 443.000     MV DT 232.000     E wave deceleration time 232.000     MV A Velocity 35.100     MV Peak A Vel 0.351     MV E Velocity 81.200     MV Peak E Vel 81.200     MV ERO 0.040     MV E/A 2.300     Mitral Valve Area by Proximal Isovelocity Surface Area 0.570     MV E' Tissue Velocity Medial 155.000     PR End Diastolic Velocity 57.600     PV PEAK VELOCITY 57.600     PV peak gradient 1.000     TAPSE 1.350     TR Peak Velocity 3.050     TR Peak Gradient 37.000     TV Peak Velocity 343.000     TV Mean Vel 343.000     TV peak gradient 47.000     TV mean gradient 35.000     Tricuspid Valve Regurgitation Velocity Time Interval 122.000     TV Mean Gradient  35.000     LV Mass 148.000      ECG: ECG Results          ECG 12 lead (In process)  Result time 11/13/23 14:07:48    In process             Narrative:   Diagnosis Class Abnormal Acquisition Device D3K Systolic BP 139 Diastolic BP 63 Ventricular Rate 61 Atrial Rate 74 QRS Duration 148 Q-T Interval 500 QTC Calculation(Bazett) 503 Calculated R Axis -73 Calculated T Axis 99  Diagnosis Ventricular-paced rhythm Abnormal ECG When compared with ECG of 13-Nov-2023 11:06, No significant change was found  ECG 12 lead (Final result)  Result time 11/13/23 11:56:16    Final result             Narrative:   Diagnosis Class Abnormal Acquisition Device D3K Ventricular Rate 60 Atrial Rate 53 QRS Duration 150 Q-T Interval 504 QTC Calculation(Bazett) 504 Calculated R Axis -73 Calculated T Axis 101  Diagnosis Ventricular-paced rhythm Abnormal ECG When compared with ECG of 13-Nov-2023 09:48, Vent. rate has decreased BY   7 BPM Pylant, Prentice [phone]) on 11/13/2023 11:56:07 AM certifies that he/she has reviewed the ECG tracing and confirms the independent  interpretation is correct.                         ECG 12 lead (Final result)  Result time 11/13/23 10:35:02    Final result             Narrative:   Diagnosis Class Abnormal Acquisition Device D3K Systolic BP 135 Diastolic BP 59 Ventricular Rate 67 Atrial Rate 31 QRS Duration 142 Q-T Interval 464 QTC Calculation(Bazett) 490 Calculated R Axis -73 Calculated T Axis 106  Diagnosis Ventricular-paced rhythm Abnormal ECG When compared with ECG of 12-Aug-2022 13:00, PVCs no longer present Pylant, Prentice ([id number]) on 11/13/2023 10:34:54 AM certifies that he/she has reviewed the ECG tracing and confirms the independent  interpretation is correct.                                                                                  Pre-Sedation Procedures    Medical Decision Making The differential diagnosis associated with the patient's presentation includes: Pneumonia, pneumothorax, COPD exacerbation, CHF exacerbation, acute viral syndrome, ACS, PE  I considered admitting/observing and elected to, admit the patient due to CHF exacerbation.  Problems Addressed: Acute hypoxic respiratory failure (*): acute illness or injury Acute on chronic congestive heart failure, unspecified heart failure type (*): chronic illness or injury COPD exacerbation (*): acute illness or injury Dyspnea, unspecified type: acute illness or injury  Amount and/or Complexity of Data Reviewed Labs: ordered. Decision-making details documented in ED Course. Radiology: ordered and independent interpretation performed. Decision-making details documented in ED Course.    Details: I personally viewed and interpreted x-ray images of the chest: There is bilateral pulmonary edema. ECG/medicine tests: ordered and independent interpretation performed. Decision-making details documented in ED Course. Discussion of management or test interpretation with external provider(s): Ybarra NP NICS for admission  Risk OTC drugs. Prescription drug management. Decision regarding hospitalization.         Provider Communication  New Prescriptions   No medications on file    Modified Medications   No medications on file    Discontinued Medications   No medications on file    Clinical Impression Final diagnoses:  Dyspnea, unspecified type  Acute on chronic congestive heart failure, unspecified heart failure type (*)  COPD exacerbation (*)  Acute hypoxic respiratory failure (*)    ED Disposition     ED Disposition  Admit   Condition  --   Comment  --                   Electronically signed by:  Prentice Left, MD 11/13/23 [phone]

## 2023-11-17 NOTE — Progress Notes (Addendum)
 [facility] Case Management     Patient:   Johnny Woods MR Number:  [id number] Patient Date of Birth: [birth year] Age/Sex:  83 y.o./male     CM spoke with Selinda at [employer] [phone] faxed orders to [phone] (received confirmation email).  Selinda reports this patient [name] is a supplemental policy and he will need to get oxygen through [facility].    CM faxed o2 orders, face sheet, 02 sats progress note to [email or web address].    Electronically signed: Darice Goldmann, MSW 11/17/2023 / 1:15 PM

## 2023-11-18 NOTE — Discharge Summary (Signed)
 [facility] [facility]  [facility] Inpatient Discharge Summary  PCP: Refugia Lofts (Inactive) Discharge Details   Admit date:         11/13/2023 Discharge date:        11/18/2023  Hospital Days:    5 days  Code Status:   No CPR Advanced Directives on file: Living Will Teacher, music of Desire for Natural Death)     Living Will Teacher, music of Desire for Natural Death): Yes, copy in chart  Discharge Diagnoses:  Principal Problem:   Acute hypoxic respiratory failure (*) Active Problems:   Atrial fibrillation (*)   Acute exacerbation of chronic obstructive pulmonary disease (COPD) (*)   Stage 4 chronic kidney disease (*)   Acute on chronic heart failure with preserved ejection fraction (HFpEF) (*)   Community acquired pneumonia, bilateral   Smoker   Task list for follow-up: Home oxygen arranged, follow-up with PCP.  Smoking cessation   Follow-Up Appointments Suggested: Follow-up with Primary Care Physician   Hospital follow-up COPD exacerbation  Refug[place] [place] [address] [phone]     Follow-Up Appointments Already Scheduled: No future appointments.  Discharge Medications: Current Discharge Medication List     DISCONTINUED medications     umeclidinium bromide (INCRUSE ELLIPTA) 62.5 mcg/inh inhalation powder        CONTINUED medications   Details  albuterol  sulfate HFA (PROVENTIL ,VENTOLIN ,PROAIR ) 108 (90 Base) MCG/ACT inhaler Inhale two puffs into the lungs every 6 (six) hours as needed for Wheezing.    apixaban  (ELIQUIS ) 2.5 mg tablet Take one tablet (2.5 mg dose) by mouth 2 (two) times daily.    budesonide (PULMICORT) 0.25 mg/2 mL nebulizer solution Take 2 mLs (0.25 mg dose) by nebulization 2 (two) times daily.    carvedilol (COREG) 12.5 mg tablet Take one half tablet (6.25 mg dose) by mouth 2 (two) times daily.    furosemide  (LASIX ) 40 mg tablet Take one tablet (40 mg dose) by mouth daily.    glyBURIDE (DIABETA) 5 mg  tablet Take one tablet (5 mg dose) by mouth with breakfast.    ipratropium (ATROVENT) 0.03% nasal spray two sprays by Nasal route 3 (three) times a day as needed for Rhinitis.    ipratropium-albuterol  (DUONEB) 0.5-2.5 mg/3 mL ML SOLN nebulizer solution Inhale 3 mLs into the lungs every 4 (four) hours as needed [facility]    oxyCODONE  HCl (ROXICODONE ) 5 mg immediate release tablet Take one tablet (5 mg dose) by mouth every 8 (eight) hours as needed for Pain.    rOPINIRole  (REQUIP ) 1 mg tablet Take one tablet (1 mg dose) by mouth at bedtime.    sildenafil citrate (VIAGRA) 100 mg tablet Take one tablet (100 mg dose) by mouth daily as needed for Erectile Dysfunction.    tiotropium-olodaterol (STIOLTO RESPIMAT) 2.5-2.5 mcg/actuation inhaler Inhale two puffs into the lungs daily.    triamcinolone acetonide (KENALOG) 0.1% cream Apply one g (1 Application dose) topically 2 (two) times a day as needed (rash).        Allergies: Allergies  Allergen Reactions  . Amlodipine Unknown    Consultations this Admission: IP CONSULT TO HOSPITALIST COPD NAVIGATOR CONSULT IP CONSULT TO CASE MANAGEMENT, RN/SW CONSULT HEART FAILURE SUPPORT (NAVIGATOR / CHF CLINIC)  Procedures/Imaging:     Echocardiogram Complete WO Enhancing Agent  Final Result  Left Ventricle: Systolic function is low normal. EF: 50-55%.   Quantitative analysis of left ventricular Global Longitudinal Strain (GLS)   imaging is -15.100%, which is abnormal. Ejection fraction measured by 3D  is 55%, which is normal.  .  Left Atrium: Left atrium is moderately dilated.  .  Right Ventricle: Right ventricle size is normal.  .  Right Atrium: Right atrium is mildly to moderately dilated.  .  Right Atrium: A pacemaker wire is present in the right atrium.  SABRA  Aortic Valve: The aortic valve is tricuspid. The leaflets are not   thickened and exhibit normal excursion. There is mild sclerosis.  .  Aortic Valve: There is no regurgitation or  stenosis.  .  Mitral Valve: Mitral valve structure is normal.  .  Aorta: The aortic root is normal in size.  .  Tricuspid Valve: The right ventricular systolic pressure is moderately   elevated (50-59 mmHg).  .  Tricuspid Valve: Tricuspid valve structure is normal.  .  Pulmonic Valve: Pulmonic valve structure is normal.      XR Chest Ap Portable  Final Result  IMPRESSION:    Bilateral interstitial infiltrate/edema.    Electronically Signed by: Dallas Jubilee, MD on 11/13/2023 10:43 AM      Pertinent Labs:  Cardiac Labs: Recent Labs    Units 11/13/23 1000  BNP pg/mL 9,041*   CBC: Recent Labs    Units 11/16/23 0837 11/14/23 0306 11/13/23 1000  WBC thou/mcL 6.8 3.8* 4.9  HGB gm/dL 89.2* 89.3* 87.7*  PLT thou/mcL 162 154 169   BMP: Recent Labs    Units 11/18/23 0811 11/16/23 1252 11/16/23 0836 11/15/23 1224 11/14/23 0306 11/13/23 1000  NA mmol/L 138  --  141  --  136 136  K mmol/L 3.5*  --  3.5*  --  4.0 4.1  CL mmol/L 95*  --  100  --  99 98  CO2 mmol/L 32  --  29  --  25 25  BUN mg/dL 65*  --  63*  --  53* 48*  CREATININE mg/dL 7.93* 7.88* 7.88* 7.71* 2.56* 2.63*  MAGNESIUM mg/dL  --   --   --   --   --  2.7*   Lipid Panel: No results for input(s): CHOL, TRIG, HDL, LDL in the last 168 hours. Liver Enzymes: Recent Labs    Units 11/13/23 1000  AST U/L 22  ALT U/L 12  ALKPHOS U/L 106  BILITOT mg/dL 0.9   Endocrine Panels: Recent Labs    Units 11/18/23 0811 11/16/23 0836 11/14/23 0306 11/13/23 1000  GLUCOSE mg/dL 861* 875* 791* [facility]   Physicians involved in care during this hospitalization Attending Provider: Prentice Left, MD Attending Provider: Balinda GORMAN Short, DO Attending Provider: Derryl Duval, MD Admitting Provider: Balinda GORMAN Short, DO Consulting Physician: [facility] Consult To [facility] Consulting Physician: Reyes Ray Rumpf, FNP Consulting Physician: Derryl Duval, MD  HPI  per admitting provider:   Natasha Paulson Lombardo is a 83 y.o. male COPD, A fib, s/p ablation, Eliquis , Pacer, chronic diastolic HF, CKD IV, LUL NSCLC s/p ctx/rtx, chronic pain syndrome who was sent from the [place] with dyspnea and hypoxia.    He reports over the last 2 days his sputum has gotten thicker and darker. He had tried to use his nebulizer at home with no improvement in his dyspnea. He denies any fevers or chills. He does not use oxygen at home. He does admit he still smokes about 1/2ppd.  He got 6 teeth pulled last week and reports he still isnt use to using his new dentures, limiting how much he has been eating. He lives at  home with his wife. He typically is able to ambulate without his cane or walker. He states his neuropathy in his feet is typically what limits how much time he stays standing or walking. He used Oxycodone  as needed for his neuropathy.    Patient was wheezing with crackles on exam.  Was hypoxic requiring supplemental oxygen.Chest x-ray revealed bilateral interstitial infiltrates/edema.  Patient admitted and treated as COPD exacerbation/CHF exacerbation/pneumonia.  Hospital Course:     # acute hypoxic respiratory failure secondary to acute on chronic diastolic CHF and pneumonia, possibly secondary to gram negatives #Pulmonary hypertension  Patient showed gradual response to IV Lasix , IV steroids, IV antibiotics and supportive care with nebulizers and supplemental oxygen.  Echo showed EF of 50-55%, RVSP moderately elevated at 50-59 mmHg.  He was able to wean down to 2 L/min of supplemental oxygen. Patient requested discontinuing his steroids which was not prescribed on discharge. Oxygen prescribed for home use. Completed IV antibiotics. Resume p.o. Lasix  40 mg daily that he uses at home. [facility]    # copd exacerbation-continue nebs, steroids, mucolytics   # a fib-rate controlled. Continue coreg and eliquis    # tobacco  abuse-encouraged cessation and offered nicotine patch.     BP (!) 154/73   Pulse 68   Temp 97.9 F (36.6 C) (Oral)   Resp 22   Ht 1.753 m (5' 9)   Wt 54.9 kg (121 lb)   SpO2 94%   BMI 17.87 kg/m   Physical Exam . General: Chronically ill HEENT: Poor dentures Chest: Mild wheezing bilaterally CVS: S1, S2, no murmur, regular rhythm Abdomen: Soft, nontender Extremities: No edema Post Hospital Care   Activity:   Weight Bearing Status:          Oxygen Orders for Discharge: O2 Device: Nasal cannula SpO2: 94 % O2 Flow Rate (L/min): 1 L/min A RR: 18 A RR: 18  Diet: Diet and Nourishment Orders (From admission, onward)     Start       11/15/23 1247  Diet Puree 4  Diet effective now                   Wound Care Recommendations:    Lines/Drains/Airways: Patient Lines/Drains/Airways Status     Active LDAs     Name Placement date Placement time Site Days   Peripheral IV 20 G Anterior;Left Upper Arm 11/13/23  1011  Upper Arm  5            Therapy Recommendations:  PT:                  OT:            SLP:              Home Health Orders: DME Orders (From admission, onward)            For Home Use Only DME Gaseous Oxygen  (Arrange for Home Use Only DME Gaseous Oxygen)  Once (Routine)       Question:  O2 rate:  Answer:  2 Lpm  Placed in And Linked Group             Home Health Agency     None       I spent 35 minutes performing discharge services.   Electronically signed: Derryl Duval, MD 11/18/2023 / 3:55 PM

## 2024-04-17 NOTE — H&P (Signed)
 [facility]  History and Physical   Assessment    Patient is a 83 year old male, with a PMH of GOLD C COPD Without Home O2, Prediabetes, Essential HTN, and CKD Stage IV, who presents to the hospital for Acute COPD Exacerbation and Acute Hypoxic Respiratory Failure.    Plan    # Acute GOLD C COPD Exacerbation- CXR was negative for pneumonia and COVID/FLU/RSV tests were negative. Will start Duonebs every 6 hours and PRN q4 hours with IV Azithromycin  500 mg daily. Will start IV solumedrol 125 mg x1 then 40 mg daily. Will obtain CT Chest to further evaluate for pneumonia.   # Acute Hypoxic Respiratory Failure- Currently HHFNC. Secondary to above. Will provide supplemental O2 to maintain O2 sats between 90-92%. Will wean as able.  Will place on continuous Pulse Ox.   # Prediabetes-  Patient is at increase risk of hyperglycemia given steroid use. Will start Carb consistent diet and trend BS every QAC/QHS. Will obtain Hg A1C.   # Essential HTN- Will continue Amlodipine 10 mg daily, Lasix  40 mg daily, and Enalapril 20 mg daily.   # CKD Stage IV- Baseline Cr is elevated between 2.0 to 2.3. with current Cr of 2.28,  Will renally dose all meds.   # PAF- Will continue Coreg 12.5 mg BID and Apixaban  2.5 mg BID.   # Tobacco Use- Patient smokes 1 pack a day. Will start nicotine patch. Patient will need to be educated on tobacco cessation prior to discharge.  # Underweight with BMI less than 18.5- BMI is low at 17.72. Will place nutrition consult   DVT- Apxiaban Diet- Carb Consistent/Cardiac IVF- None  Expected length of stay greater than 2 midnights admitted to the hosptial under inpatient status for medical complexity including Acute COPD Exacerbation and Acute Hypoxic Respiratory Failure Requiring Frequent Nebulizer Treatment and Supplemental O2.      Fluid and electrolyte disorder: N/A, present on admission  Plan: N/A  Abnormal blood counts: N/A, present on  admission Plan: N/A  Nutritional status: Body mass index is 17.72 kg/m. N/A Plan: N/A Consistent Carbohydrate Cardiac  Coagulation defect: N/A, present on admission Plan: N/A  Elevated LFTs/transaminitis due to: N/A, present on admission Plan: N/A  Debility: N/A, present on admission Plan: N/A     History  CC- Shortness of Breath  HPI- Patient is a 83 year old male, with a PMH of GOLD C COPD Without Home O2, Prediabetes, Essential HTN, and CKD Stage IV, who presents to the hospital for shortness of breath. Patient developed a sudden onset of severe cough, wheezing, and shortness of breath around 1600 on 04-17-2024 prompting patient to seek medical care. No recent fevers, chills, sore throat, or nasal congestion.  Of note, patient has had a COPD exacerbation in the past year and normally very active.  Personally Reviewed [facility] Discharge Note on 11-18-2023 for Acute COPD Exacerbation   Personally Reviewed CXR- Hyperinflated Lung, No Pneumothorax, or Pulmonary Edema.    PSH- Cholecystectomy, and Back Surgery  FH- Diabetes- Brother and Sister, Breast Cancer- Sister, Colon Cancer- Brother.   Past Medical History:  Diagnosis Date  . Atrial fibrillation (*)   . CHF (congestive heart failure) (*)   . CKD (chronic kidney disease)   . COPD (chronic obstructive pulmonary disease) (*)   . Diabetes mellitus (*)   . Hypertension   . Lung cancer (*)    No past surgical history on file.  Allergies[1] Prior to Admission medications  Medication Sig Start Date End Date Taking? Authorizing Provider  albuterol  sulfate HFA (PROVENTIL ,VENTOLIN ,PROAIR ) 108 (90 Base) MCG/ACT inhaler Inhale two puffs into the lungs every 6 (six) hours as needed for Wheezing. 11/22/21   Erla Simper, MD  apixaban  (ELIQUIS ) 2.5 mg tablet Take one tablet (2.5 mg dose) by mouth 2 (two) times daily. 11/22/21   Erla Simper, MD  budesonide (PULMICORT) 0.25 mg/2 mL nebulizer solution Take 2 mLs  (0.25 mg dose) by nebulization 2 (two) times daily. 09/25/23   Historical Provider, MD  carvedilol (COREG) 12.5 mg tablet Take one half tablet (6.25 mg dose) by mouth 2 (two) times daily. 05/31/23   Historical Provider, MD  enalapril maleate (VASOTEC) 20 mg tablet Take one tablet (20 mg dose) by mouth daily.    Historical Provider, MD  furosemide  (LASIX ) 40 mg tablet Take one tablet (40 mg dose) by mouth daily. 11/23/21   Fernando Crotte, MD  glyBURIDE (DIABETA) 5 mg tablet Take one tablet (5 mg dose) by mouth with breakfast. Patient not taking: Reported on 11/13/2023 11/22/21   Fernando Crotte, MD  ipratropium (ATROVENT) 0.03% nasal spray two sprays by Nasal route 3 (three) times a day as needed for Rhinitis. 09/25/23   Historical Provider, MD  ipratropium-albuterol  (DUONEB) 0.5-2.5 mg/3 mL ML SOLN nebulizer solution Inhale 3 mLs into the lungs every 4 (four) hours as needed [facility]). 09/25/23   Historical Provider, MD  oxyCODONE  HCl (ROXICODONE ) 5 mg immediate release tablet Take one tablet (5 mg dose) by mouth every 8 (eight) hours as needed for Pain. 10/19/23   Historical Provider, MD  rOPINIRole  (REQUIP ) 1 mg tablet Take one tablet (1 mg dose) by mouth at bedtime. 06/27/23   Historical Provider, MD  sildenafil citrate (VIAGRA) 100 mg tablet Take one tablet (100 mg dose) by mouth daily as needed for Erectile Dysfunction. 06/27/23   Historical Provider, MD  tiotropium-olodaterol (STIOLTO RESPIMAT) 2.5-2.5 mcg/actuation inhaler Inhale two puffs into the lungs daily. 09/25/23   Historical Provider, MD  triamcinolone acetonide (KENALOG) 0.1% cream Apply one g (1 Application dose) topically 2 (two) times a day as needed (rash). 12/26/22   Historical Provider, MD   Social History[2] No family history on file. Review of Systems  Unable to perform ROS: Acuity of condition        Physical Examination  Temp:  [98 F (36.7 C)] 98 F (36.7 C) Heart Rate:  [60-63] 61 Resp:  [26] 26 BP: (136-150)/(59-67)  150/67 SpO2:  [83 %-98 %] 98 %   O2 Device: Heated high flow O2 Flow Rate (L/min): 55 L/min FiO2: 100 %    Physical Exam Vitals reviewed.  Constitutional:      Appearance: Normal appearance. He is not ill-appearing.     Comments: Cachectic   HENT:     Head: Normocephalic and atraumatic.  Eyes:     General:        Right eye: No discharge.        Left eye: No discharge.     Extraocular Movements: Extraocular movements intact.     Conjunctiva/sclera: Conjunctivae normal.  Cardiovascular:     Rate and Rhythm: Normal rate and regular rhythm.     Heart sounds: Normal heart sounds. No murmur heard.    No friction rub.  Musculoskeletal:        General: No deformity or signs of injury.     Cervical back: Neck supple. No tenderness.     Right lower leg: No edema.     Left lower  leg: No edema.  Pulmonary:     Effort: Pulmonary effort is normal. No respiratory distress.     Breath sounds: Normal breath sounds. No stridor. No wheezing.  Abdominal:     General: Abdomen is flat. Bowel sounds are normal. There is no distension.     Palpations: Abdomen is soft. There is no mass.     Tenderness: There is no abdominal tenderness.  Lymphadenopathy:     Cervical: No cervical adenopathy.  Skin:    General: Skin is warm and dry.     Coloration: Skin is not jaundiced or pale.     Findings: No bruising.  Neurological:     General: No focal deficit present.     Mental Status: He is alert and oriented to person, place, and time. Mental status is at baseline.     Cranial Nerves: No cranial nerve deficit.  Psychiatric:        Mood and Affect: Mood normal.        Behavior: Behavior normal.        Thought Content: Thought content normal.        Judgment: Judgment normal.   Results  Labs:  Recent Results (from the past 24 hours)  CBC And Differential   Collection Time: 04/17/24  6:33 PM  Result Value Ref Range   WBC 6.0 4.0 - 10.5 thou/mcL   RBC 3.65 (L) 4.63 - 6.08 million/mcL   HGB  11.7 (L) 13.7 - 17.5 gm/dL   HCT 64.3 (L) 59.8 - 48.9 %   MCV 97.5 (H) 79.0 - 92.2 fL   MCH 32.1 25.7 - 32.2 pg   MCHC 32.9 32.3 - 36.5 gm/dL   Plt Ct 777 849 - 599 thou/mcL   RDW SD 55.6 (H) 35.1 - 46.3 fL   MPV 9.3 (L) 9.4 - 12.4 fL   NRBC% 0.0 0.0 - 0.2 /100WBC   Absolute NRBC Count 0.00 0.00 - 0.01 thou/mcL   NEUTROPHIL % 73.2 %   LYMPHOCYTE % 17.0 %   MONOCYTE % 6.2 %   Eosinophil % 2.5 %   BASOPHIL % 0.8 %   IG% 0.3 %   ABSOLUTE NEUTROPHIL COUNT 4.38 1.50 - 7.50 thou/mcL   ABSOLUTE LYMPHOCYTE COUNT 1.02 1.00 - 4.50 thou/mcL   Absolute Monocyte Count 0.37 0.10 - 0.80 thou/mcL   Absolute Eosinophil Count 0.15 0.00 - 0.50 thou/mcL   Absolute Basophil Count 0.05 0.00 - 0.20 thou/mcL   Absolute Immature Granulocyte Count 0.02 0.00 - 0.03 thou/mcL   Comprehensive Metabolic Panel   Collection Time: 04/17/24  6:33 PM  Result Value Ref Range   Na 137 136 - 146 mmol/L   Potassium 3.9 3.7 - 5.4 mmol/L   Cl 98 97 - 108 mmol/L   CO2 23 20 - 32 mmol/L   AGAP 16 7 - 16 mmol/L   Glucose 125 (H) 65 - 99 mg/dL   BUN 37 (H) 8 - 27 mg/dL   Creatinine 7.71 (H) 9.23 - 1.27 mg/dL   Ca 9.9 8.6 - 89.7 mg/dL   ALK PHOS 841 25 - 839 U/L   T Bili 1.0 0.0 - 1.2 mg/dL   Total Protein 8.6 (H) 6.0 - 8.5 gm/dL   Alb 4.6 3.5 - 4.7 gm/dL   GLOBULIN 4.0 1.5 - 4.5 gm/dL   ALBUMIN/GLOBULIN RATIO 1.2 1.1 - 2.5   BUN/CREAT RATIO 16.2 11.0 - 26.0   ALT 12 0 - 55 U/L   AST 25 0 - 40 U/L  eGFR 28 (L) >=60 mL/min/1.2m2  NT-proBNP   Collection Time: 04/17/24  6:33 PM  Result Value Ref Range   NT-ProBNP 3,166 (H) <=1,799 pg/mL  Procalcitonin   Collection Time: 04/17/24  6:33 PM  Result Value Ref Range   Procalcitonin 0.11 (H) 0.00 - 0.08 ng/mL  Magnesium   Collection Time: 04/17/24  6:33 PM  Result Value Ref Range   Mg 2.9 (H) 1.6 - 2.6 mg/dL  Gen5 Cardiac Troponin T (TnT5) Baseline Series at: baseline, 1 hour, and 3 hour; Onset of symptoms: Greater than or equal 3 hours   Collection Time:  04/17/24  6:33 PM  Result Value Ref Range   TnT-Gen5 (0hr) 56 (H) <22 ng/L  COVID-19, Flu A+B and RSV Nasopharyngeal Swab Nasopharyngeal Swab   Collection Time: 04/17/24  6:34 PM  Result Value Ref Range   Flu A Negative Negative   Flu B Negative Negative   RSV PCR Negative Negative   SARS-COV-2 Not Detected Not Detected  Blood Gas, Arterial w/Lactic - Yes reflex 2 hour   Collection Time: 04/17/24  7:02 PM  Result Value Ref Range   PH ARTERIAL 7.400 7.350 - 7.450   PO2 ARTERIAL 49.5 (LL) 80.0 - 100.0 mmHg   PCO2 39.2 35.0 - 45.0 mmHg   HCO3 24 21 - 28 mmol/L   Base Excess -1 -2 - 2 mmol/L   LACTIC ACID ARTERIAL 0.90 0.50 - 2.00 mmol/L   OXYGEN SATURATION MEASURED ART 85.9 %   PATIENT TEMPERATURE 98.6 Deg   FRACTION OF INSPIRED OXYGEN 44 %   LITER FLOW ARTERIAL 6 L/min   VENTILATION RATE 23    ALLENS TEST Y    RESPIRATORY CARE NUMBER OF STICKS BCH 1    SITE LB    VENTILATION MODE ARTERIAL N/C   Gen5 Cardiac Troponin T (TnT5) 1H   Collection Time: 04/17/24  7:29 PM  Result Value Ref Range   TnT-Gen5 (1hr) 50 (H) <22 ng/L   Delta 1 Hour -6 <5 ng/L   Imaging: XR Chest Ap Portable Result Date: 04/17/2024 Single view of the chest. HISTORY: Shortness of breath. Comparison made to 11/13/2023. A cardiac device projects over the left hemithorax. There are diffuse increased interstitial markings which have not significant changed in appearance since 08/12/2022 and probably 3 chronic in nature. No definite infiltrate is identified however a small infiltrate could be obscured by the chronic interstitial changes. There is no large pleural effusion. The cardiac silhouette is not enlarged. The osseous structures are stable.   IMPRESSION: Chronic interstitial markings unchanged in appearance since 08/12/2022. No definite infiltrate identified. Electronically Signed by: Marinda Fleming, MD on 04/17/2024 8:06 PM  ECG: ECG 12 lead Result Date: 04/17/2024 Diagnosis Class Abnormal Acquisition Device D3K  Systolic BP 135 Diastolic BP 63 Ventricular Rate 71 Atrial Rate 76 QRS Duration 152 Q-T Interval 446 QTC Calculation(Bazett) 484 Calculated R Axis -75 Calculated T Axis 109 Diagnosis Ventricular-paced rhythm with frequent premature ventricular complexes Abnormal ECG When compared with ECG of 13-Nov-2023 12:52, premature ventricular complexes are now present Vent. rate has increased BY  10 BPM no ischemia within limits of paced rhythm Brien Kung ([id number]) on 04/17/2024 6:39:44 PM certifies that he/she has reviewed the ECG tracing and confirms the independent  interpretation is correct.   Coding  Electronically signed: Yancy Sorrel, MD 04/17/2024 / 9:07 PM       [1] Allergies Allergen Reactions  . Amlodipine Unknown  [2] Social History Socioeconomic History  . Marital status: Married  Tobacco  Use  . Smoking status: Every Day    Types: Cigarettes    Start date: 34  . Tobacco comments:    Pt has quit several times for total of 14 years;58-14=44  Substance and Sexual Activity  . Alcohol use: Not Currently  . Drug use: Never

## 2024-04-23 NOTE — Discharge Summary (Addendum)
 [facility] Discharge Summary  PCP: Refugia Lofts (Inactive) Discharge Details   Admit date:         04/17/2024 Discharge date:        04/23/2024  Hospital LOS:    6 days DIscharge Disposition:  Home  Active Hospital Problems   Diagnosis Date Noted POA  . *COPD with acute exacerbation (*) 11/19/2021 Yes  . Tobacco use 04/17/2024 Yes  . Underweight (BMI < 18.5) 04/17/2024 Not Applicable  . Acute hypoxic respiratory failure (*) 11/13/2023 Yes  . Prediabetes 11/22/2021 Yes  . Essential hypertension 11/19/2021 Yes  . CKD (chronic kidney disease), stage IV (*) 11/19/2021 Yes  . PAF (paroxysmal atrial fibrillation) (*) 11/19/2021 Yes    Resolved Hospital Problems  No resolved problems to display.      Current Discharge Medication List     START taking these medications      Details  Blood Glucose Monitoring Suppl Devi  1 Device by Does not apply route once for 1 dose. Use as directed.  Pharmacy, please dispense this brand of blood glucose monitoring device: Any (patient's choice) Quantity: 1 each   cefdinir 300 mg capsule Commonly known as: OMNICEF  Take one capsule (300 mg dose) by mouth daily for 5 days. Indication: Community Acquired Pneumonia Quantity: 5 capsule   glucose blood test strip  Use as directed. Pharmacy, please dispense this brand of blood glucose test strips: Any (patient's choice) Quantity: 100 each   guaiFENesin 400 mg tablet Commonly known as: ALLFEN,SM CHEST,HUMIBID-E  Take one tablet (400 mg dose) by mouth every 6 (six) hours as needed. Quantity: 30 tablet   hydrALAZINE HCl 25 mg tablet Commonly known as: APRESOLINE  Take one tablet (25 mg dose) by mouth every 8 (eight) hours for 30 days. Quantity: 90 tablet   insulin  lispro (HUMALOG,ADMELOG) 100 Units/mL injection  Inject one Units to six Units into the skin mealtime. Quantity: 3 mL   Insulin  Pen Needle 32G X 5 MM Misc  one each by Does not apply route 4 (four)  times daily. Quantity: 100 each   Lancets Misc  one hundred each by Does not apply route 3 (three) times a day. Quantity: 100 each   pantoprazole sodium 40 mg tablet Commonly known as: PROTONIX  Take one tablet (40 mg dose) by mouth daily. Quantity: 30 tablet   polyethylene glycol 17 g packet Commonly known as: MIRALAX  Start taking on: April 24, 2024  Take 120 mLs (17 g dose) by mouth daily for 3 days. Quantity: 3 packet   predniSONE  10 mg tablet Commonly known as: DELTASONE  Start taking on: April 24, 2024  Take four tablets (40 mg dose) by mouth daily for 3 days, THEN three tablets (30 mg dose) daily for 5 days, THEN two tablets (20 mg dose) daily for 5 days, THEN one tablet (10 mg dose) daily for 5 days, THEN one half tablet (5 mg dose) daily for 5 days. Quantity: 45 tablet       CONTINUE these medications which have NOT CHANGED      Details  albuterol  sulfate HFA 108 (90 Base) MCG/ACT inhaler Commonly known as: PROVENTIL ,VENTOLIN ,PROAIR   Inhale two puffs into the lungs every 6 (six) hours as needed for Wheezing. Quantity: 8 g   apixaban  2.5 mg tablet Commonly known as: ELIQUIS   Take one tablet (2.5 mg dose) by mouth 2 (two) times daily. Quantity: 60 tablet   carvedilol 12.5 mg tablet Commonly known as: COREG  Take one half  tablet (6.25 mg dose) by mouth 2 (two) times daily.   docusate sodium 50 mg capsule Commonly known as: COLACE  Take one capsule (50 mg dose) by mouth 2 (two) times daily.   ipratropium-albuterol  0.5-2.5 mg/3 mL ML Soln nebulizer solution Commonly known as: DUONEB  Inhale 3 mLs into the lungs every 4 (four) hours as needed [facility]).   mometasone 220 mcg/inhalation inhaler Commonly known as: ASMANEX  Inhale two puffs into the lungs daily.   oxyCODONE  HCl 5 mg immediate release tablet Commonly known as: ROXICODONE   Take one tablet (5 mg dose) by mouth every 8 (eight) hours as needed for Pain.   rOPINIRole  1 mg tablet Commonly known  as: REQUIP   Take one tablet (1 mg dose) by mouth at bedtime.   sildenafil citrate 100 mg tablet Commonly known as: VIAGRA  Take one tablet (100 mg dose) by mouth daily as needed for Erectile Dysfunction.   tiotropium-olodaterol 2.5-2.5 mcg/actuation inhaler Commonly known as: STIOLTO RESPIMAT  Inhale two puffs into the lungs daily.   triamcinolone acetonide 0.1% cream Commonly known as: KENalog  Apply one g (1 Application dose) topically 2 (two) times a day as needed (rash).      * You might also be taking other medications not listed above. If you have questions about any of your other medications, talk to the person who prescribed them or your Primary Care Provider.          STOP taking these medications    azithromycin  250 mg tablet Commonly known as: ZITHROMAX    enalapril maleate 20 mg tablet Commonly known as: VASOTEC   furosemide  40 mg tablet Commonly known as: LASIX    glyBURIDE 5 mg tablet Commonly known as: DIABETA   ipratropium 0.03% nasal spray Commonly known as: ATROVENT        Reason for Medication Changes:  #Acute Hypoxic Respiratory Failure #Community acquired Pneumonia #Severe emphysema  Hospital Course  Physicians involved in care during this hospitalization Attending Provider: Yancy Sorrel, MD Attending Provider: Arlin Minion, MD Attending Provider: Deedee Flatten, MD Admitting Provider: Yancy Sorrel, MD Consulting Physician: Yancy Sorrel, MD Consulting Physician: Cathlean FORBES Lanius, MD Consulting Physician: Nancyann Ozell Duet, MD Consulting Physician: Cordella GORMAN Rim, PA  Indication for Admission:  Acute COPD Exacerbation and Acute Hypoxic Respiratory Failure.   History of Present Illness as per H&P: Patient is a 83 year old male, with a PMH of GOLD C COPD Without Home O2, Prediabetes, Essential HTN, and CKD Stage IV, who presents to the hospital for shortness of breath. Patient developed a sudden onset of severe cough,  wheezing, and shortness of breath around 1600 on 04-17-2024 prompting patient to seek medical care. No recent fevers, chills, sore throat, or nasal congestion. Of note, patient has had a COPD exacerbation in the past year and normally very active. Personally Reviewed [facility] Discharge Note on 11-18-2023 for Acute COPD Exacerbation  Personally Reviewed CXR- Hyperinflated Lung, No Pneumothorax, or Pulmonary Edema.   Hospital Course:       #Acute Hypoxic Respiratory Failure #Community acquired Pneumonia #Severe emphysema Pulmonology was consulted Patient was on HHFNC with 40L initially and was able to wean off of the oxygen.  Remains well on room air well with resting and walking.  CXR negative for pneumonia and COVID/FLU/RSV tests negative IV Azithromycin  and ceftriaxone  status post 5 days .  Will transit to cefdinir for additional 5 days. Oral prednisone  taper ICS Continue home inhaler Follow-up with primary doctor and pulmonology.   #  Acute on chronic dysphagia # Underweight with BMI less than 18.5 Speech therapy was consulted with MBSS done.  Nutritional Recommendation:  Mechanically Altered/6 Soft and Bite Sized Diet; Thin Liquids/0 Recommended Form of Meds: Crushed (in applesauce) Dietitian following. Continue speech therapy.  Patient appears to be very weak with swallowing on the assessment.  Outpatient referral for speech therapy has sent.  Patient is willing to work with therapy as outpatient. Palliative care was consulted. Patient's CODE STATUS transited to DNR DNI with limited additional interventions.  Suggesting oral Dilaudid instead of oxycodone  due to patient's renal failure.  Patient has trialed 1 dose of oral Dilaudid while in the hospital and he has been tolerating well.  [facility]  Suggest to consider switch patient's chronic oxycodone  to oral Dilaudid as outpatient.  Patient will discuss with the provider during hospital  follow-up.  # Prediabetes   # Hyperglycemia Hyperglycemia given steroid use.  Patient is discharged with short acting insulin  with sliding scale.  Diabetic educator was consulted.  Patient is feeling comfortable to follow instructions at home.  Supplies has sent.  Anticipate insulin  use will be only for short-term.   # Normocytic anemia Patient's hemoglobin baseline seems to be around 10.6-11.7 this year.  Currently 9.9.  Consider likely secondary to chronic disease.  No signs of bleeding at this moment.  Need to continue follow-up with primary doctor  # Essential HTN- Amlodipine 10 mg daily, and Enalapril 20 mg daily.  Patient's home Lasix  was on hold as patient's creatinine was up to 2.7.  Creatinine improves.  Blood pressure remains well.  Will have patient follow-up with primary doctor to decide if Lasix  is further needed or not.  # CKD Stage IV Baseline Cr is elevated between 2.0 to 2.5.   Is and Os Continue to monitor Nephrology referral was sent.  Patient continue to follow-up with nephro as outpatient.    # PAF Coreg 12.5 mg BID and Apixaban  2.5 mg BID.    # Tobacco Use Patient smokes 1 pack a day, nicotine patch Strongly encouraged smoking cessation.     Post Hospital Care   Discharge Procedure Orders  Ambulatory referral to Pulmonology  Referral Priority: Routine Referral Type: Consultation  Referral Reason: Evaluate and Return  Requested Specialty: Pulmonary Diseases  Number of Visits Requested: 1 Expiration Date: 10/19/24   Ambulatory referral to Speech Therapy Evaluation and Treatment  Referral Priority: Routine Referral Type: Speech Therapy  Referral Reason: Evaluate and Return  Requested Specialty: Speech Pathology  Number of Visits Requested: 1 Expiration Date: 10/19/24   Ambulatory referral to Nephrology  Referral Priority: Routine Referral Type: Consultation  Referral Reason: Evaluate and Return  Requested Specialty: Nephrology  Number of Visits  Requested: 1 Expiration Date: 10/19/24   Follow-up with Primary Care Physician  Referral Priority: Routine Referral Type: Consultation  Referral Reason: Evaluate and Return  Number of Visits Requested: 1 Expiration Date: 10/19/24   Ambulatory referral to Palliative Care  Referral Priority: Routine Referral Type: Consultation  Referral Reason: Evaluate and Return  Number of Visits Requested: 1 Expiration Date: 10/19/24   Notify Physician for trouble breathing or symptoms that are worse   Notify physician - Call your doctor if you experience nausea/vomiting   Notify physician - Contact your doctor for excessive bleeding   Notify Physician and CALL 911 if you experience Chest Pain or discomfort, especially if associated with tiredness, sick on stomach or sweaty feeling.   Notify Physician and Call 911  if you experience any of the following: Sudden numbness or weakness, sudden trouble speaking, vision problems, trouble walking, sudden severe headache with no known cause.   Activity as tolerated   Discharge instructions  Order Comments: It was a pleasure caring for you during your hospital stay. We are glad you are doing better and are able to go home.   Please take the medication as instructed.   Please follow up with your Family Doctor within one week.    Return to the hospital if you have fevers greater than 101 degrees not controlled with Tylenol , difficulty breathing, chest pain, palpitations, dizziness or fainting, or for any other concerning symptoms.    Recommendations to physicians:   Followup PCP 1 week  Followup with pulmonology, speech therapy, palliative care, and nephrology  Recommendations for followup labs and/or imaging: Monitor glucose, kidney function, anemia    Code Status:   No CPR   Time spent in discharge process:  55 minutes  This note was dictated with voice recognition software. Similar sounding words can inadvertently be transcribed and may not be  corrected upon review  Electronically signed: Deedee Flatten, MD 04/23/2024 / 5:42 PM *Some images could not be shown.
